# Patient Record
Sex: Female | Born: 1990
Health system: Southern US, Community
[De-identification: ages and names within clinical notes are randomized; demographics above are authoritative.]

## PROBLEM LIST (undated history)

## (undated) DIAGNOSIS — J45909 Unspecified asthma, uncomplicated: Secondary | ICD-10-CM

## (undated) DIAGNOSIS — J309 Allergic rhinitis, unspecified: Secondary | ICD-10-CM

## (undated) HISTORY — DX: Unspecified asthma, uncomplicated: J45.909

## (undated) HISTORY — DX: Allergic rhinitis, unspecified: J30.9

---

## 2009-04-28 ENCOUNTER — Encounter: Admission: RE | Admit: 2009-04-28 | Discharge: 2009-04-28 | Payer: Self-pay | Admitting: Family Medicine

## 2010-04-11 ENCOUNTER — Encounter
Admission: RE | Admit: 2010-04-11 | Discharge: 2010-04-11 | Payer: Self-pay | Source: Home / Self Care | Attending: Allergy and Immunology | Admitting: Allergy and Immunology

## 2011-04-17 HISTORY — PX: OTHER SURGICAL HISTORY: SHX169

## 2011-06-12 ENCOUNTER — Other Ambulatory Visit: Payer: Self-pay | Admitting: Physician Assistant

## 2011-06-12 MED ORDER — TRIAMCINOLONE ACETONIDE(NASAL) 55 MCG/ACT NA INHA: 2.0000 | Freq: Every day | NASAL | Status: DC

## 2011-12-04 ENCOUNTER — Encounter: Payer: Self-pay | Admitting: Physician Assistant

## 2011-12-04 DIAGNOSIS — J309 Allergic rhinitis, unspecified: Secondary | ICD-10-CM | POA: Insufficient documentation

## 2011-12-04 DIAGNOSIS — J45909 Unspecified asthma, uncomplicated: Secondary | ICD-10-CM

## 2011-12-04 DIAGNOSIS — IMO0001 Reserved for inherently not codable concepts without codable children: Secondary | ICD-10-CM | POA: Insufficient documentation

## 2012-06-10 ENCOUNTER — Telehealth: Payer: Self-pay | Admitting: Internal Medicine

## 2012-06-10 NOTE — Telephone Encounter (Signed)
Pt would like to be a new pt w/ you. Her mother is Sherry [place], your patient too. All new pt appointments are on Weds. Pt would need to come in on a Friday, due to her work schedule. Any month ok. Pls advise. (Pt has [employer])

## 2012-06-11 NOTE — Telephone Encounter (Signed)
Ok am appt 30 minutes  In April .

## 2012-06-12 NOTE — Telephone Encounter (Signed)
lmom/kjh 

## 2012-06-13 ENCOUNTER — Encounter: Payer: Self-pay | Admitting: Pulmonary Disease

## 2012-06-13 ENCOUNTER — Ambulatory Visit (INDEPENDENT_AMBULATORY_CARE_PROVIDER_SITE_OTHER): Payer: BC Managed Care – PPO | Admitting: Pulmonary Disease

## 2012-06-13 VITALS — BP 110/72 | HR 102 | Temp 98.3°F | Ht 65.0 in | Wt 120.0 lb

## 2012-06-13 DIAGNOSIS — J309 Allergic rhinitis, unspecified: Secondary | ICD-10-CM

## 2012-06-13 DIAGNOSIS — J45909 Unspecified asthma, uncomplicated: Secondary | ICD-10-CM

## 2012-06-13 MED ORDER — MOMETASONE FURO-FORMOTEROL FUM 100-5 MCG/ACT IN AERO: 2.0000 | INHALATION_SPRAY | Freq: Two times a day (BID) | RESPIRATORY_TRACT | Status: DC

## 2012-06-13 NOTE — Progress Notes (Signed)
Chief Complaint  Patient presents with  . Pulmonary Consult    self referral for Asthma    History of Present Illness: Alison Butler is a 22 y.o. female for evaluation of asthma.  She has severe asthma and allergies, and has suffered from these for years.  She has lived in multiple locations over the past 10 years, but is originally from [place].  She was living in [place] before moving to [place].  She has been in hospital twice because of her asthma.  She has been followed by Dr. Eileen Stanford for her allergies.  She has multiple environmental allergens.  She is also allergic to dogs, cates, and dust mites.  She was previously on allergy shots.  She got a reaction to these which caused arm swelling, and she felt that allergy shots "sucked".  As a result she stopped taking allergy shots.  She was considered for Xolair, but she was concerned about expense of therapy and potential heart/cancer side effects.  She is not aware of any problems taking aspirin.  She was enrolled in trial at [facility] with Dr. Carmie Kanner for bronchothermoplasty.  She had this done from November 2013 to December 2013.  Her breathing has been doing better since she had this intervention, and non longer feels like she has her asthma symptoms any more.  She would have trouble strong smells, and cold/wet weather.  She used to get frequent episodes of chest tightness, but not so since she had procedure at [facility].  She never smoked.  She works as a Sales executive.  There is no history of pneumonia.  She does not have any pets.  Tests: CXR 04/11/10 >> Mild bronchial thickening. CT sinus 04/11/10 >> 3 mm polypoid lesion lateral aspect Lt maxillary sinus, mild mucosal thickening posterior ethmoid sinus cells Rt > Lt November 2013 >> Bronchothermoplasty [facility] with Dr. Carmie Kanner [facility] [place] contact at [facility] - [phone]) Spirometry 04/24/12 >> FEV1 2.30 (66%), FEV1% 75  Alison Butler  has a past medical history of Asthma and  Allergic rhinitis.  Alison Butler  has past surgical history that includes BRONCHOTHERMOPLASTY (2013).  Prior to Admission medications   Medication Sig Start Date End Date Taking? Authorizing Provider  DULERA 200-5 MCG/ACT AERO Inhale 2 puffs into the lungs 2 (two) times daily. 06/02/12  Yes Historical Provider, MD  levocetirizine (XYZAL) 5 MG tablet Take 1 tablet by mouth daily. 05/24/12  Yes Historical Provider, MD  loratadine (CLARITIN) 10 MG tablet Take 10 mg by mouth daily.   Yes Historical Provider, MD  montelukast (SINGULAIR) 10 MG tablet Take 1 tablet by mouth daily. 05/24/12  Yes Historical Provider, MD  NUVARING 0.12-0.015 MG/24HR vaginal ring Place 1 each vaginally every 30 (thirty) days. 06/11/12  Yes Historical Provider, MD  PROAIR HFA 108 (90 BASE) MCG/ACT inhaler Inhale 2 puffs into the lungs as needed. 04/24/12  Yes Historical Provider, MD  triamcinolone (NASACORT AQ) 55 MCG/ACT nasal inhaler Place 2 sprays into the nose daily. 06/12/11 06/13/13 Yes Ryan M Dunn, PA-C    Allergies  Allergen Reactions  . Augmentin (Amoxicillin-Pot Clavulanate) Shortness Of Breath  . Hydrocodone Nausea And Vomiting    family history includes Asthma in her father.   reports that she has never smoked. She has never used smokeless tobacco. She reports that  drinks alcohol. She reports that she does not use illicit drugs.  Review of Systems  Constitutional: Negative for fever and unexpected weight change.  HENT: Positive for ear pain, sore throat and sneezing.  Negative for nosebleeds, congestion, rhinorrhea, trouble swallowing, dental problem, postnasal drip and sinus pressure.   Eyes: Positive for itching. Negative for redness.  Respiratory: Negative for cough, chest tightness, shortness of breath and wheezing.   Cardiovascular: Negative for palpitations and leg swelling.  Gastrointestinal: Negative for nausea and vomiting.  Genitourinary: Negative for dysuria.  Musculoskeletal: Negative for joint  swelling.  Skin: Negative for rash.  Neurological: Positive for headaches.  Hematological: Bruises/bleeds easily.  Psychiatric/Behavioral: Negative for dysphoric mood. The patient is not nervous/anxious.    Physical Exam:  General - No distress ENT - No sinus tenderness, no oral exudate, no LAN, no thyromegaly, TM clear, pupils equal/reactive Cardiac - s1s2 regular, no murmur, pulses symmetric Chest - No wheeze/rales/dullness, good air entry, normal respiratory excursion Back - No focal tenderness Abd - Soft, non-tender, no organomegaly, + bowel sounds Ext - No edema Neuro - Normal strength, cranial nerves intact Skin - No rashes Psych - Normal mood, and behavior   Dg Chest 2 View  04/11/2010  Clinical Data: Cough.  Wheezing.  Asthma.   CHEST - 2 VIEW   Comparison: None   Findings: Heart size is normal.  Mediastinal shadows are normal. The lungs are clear.  There may be minimal bronchial thickening. No infiltrate, collapse or effusion.  Bony structures are unremarkable.   IMPRESSION: Possible mild bronchial thickening.  No infiltrate or collapse.  Provider: Donnita Falls   Ct Sinus Ltd W/o Cm  04/11/2010  Clinical Data: Cough and wheezing.  Asthma.   CT PARANASAL SINUS LIMITED WITHOUT CONTRAST   Technique:  Multidetector CT images of the paranasal sinuses were obtained in a single plane without contrast.   Comparison: None.   Findings: 3 mm polypoid lesion lateral aspect left maxillary sinus which is otherwise clear.  Right maxillary sinus is clear.  The infundibulum is patent bilaterally.   Visualized frontal sinuses are clear.   Mild mucosal thickening posterior ethmoid sinus air cells more notable on the right.   Visualized sphenoid sinus air cells are clear.   Visualized portions of the mastoid air cells and middle ear cavities are clear.   Anterior aspect of the nasal septum deviated towards the left.   Visualized intracranial structures and orbital structures are unremarkable.    IMPRESSION: 3 mm polypoid lesion lateral aspect left maxillary sinus which is otherwise clear.  Right maxillary sinus is clear.  The infundibulum is patent bilaterally.   Visualized frontal sinuses are clear.   Mild mucosal thickening posterior ethmoid sinus air cells more notable on the right.   Visualized sphenoid sinus air cells are clear.  Provider: Sheilah Mins    Assessment/Plan:  Coralyn Helling, MD [facility] Pager:  [phone] 06/17/2012, 9:12 AM

## 2012-06-13 NOTE — Progress Notes (Deleted)
  Subjective:    Patient ID: Alison Butler, female    DOB: 1991-04-02, 22 y.o.   MRN: [id number]  HPI    Review of Systems  Constitutional: Negative for fever and unexpected weight change.  HENT: Positive for ear pain, sore throat and sneezing. Negative for nosebleeds, congestion, rhinorrhea, trouble swallowing, dental problem, postnasal drip and sinus pressure.   Eyes: Positive for itching. Negative for redness.  Respiratory: Negative for cough, chest tightness, shortness of breath and wheezing.   Cardiovascular: Negative for palpitations and leg swelling.  Gastrointestinal: Negative for nausea and vomiting.  Genitourinary: Negative for dysuria.  Musculoskeletal: Negative for joint swelling.  Skin: Negative for rash.  Neurological: Positive for headaches.  Hematological: Bruises/bleeds easily.  Psychiatric/Behavioral: Negative for dysphoric mood. The patient is not nervous/anxious.        Objective:   Physical Exam        Assessment & Plan:

## 2012-06-13 NOTE — Patient Instructions (Addendum)
Change dulera to 100/5 two puffs twice per day Monitor your peak flow meter readings at home Will get records from Dr. Eileen Stanford Will get records from [facility] contact at [facility] - [phone]) Follow up in 6 weeks

## 2012-06-17 ENCOUNTER — Encounter: Payer: Self-pay | Admitting: Pulmonary Disease

## 2012-06-17 NOTE — Assessment & Plan Note (Addendum)
She has improved considerable after bronchothermoplasty.  She is interested in trying to decrease her medication burden.  Will decrease her dose of dulera to 100/5 two puffs twice per day (previously on 200/5 strength of dulera).  Will leave remainder of her regimen as is until follow up in 6 weeks.  Will request records from clinical trial at [facility] for bronchothermoplasty.

## 2012-06-17 NOTE — Assessment & Plan Note (Signed)
She is to continue her current regimen through Dr. Barnetta Chapel.  Reviewed benefits/risks of xolair, and advised her to discuss further with Dr. Barnetta Chapel if she is interested in pursuing this.  Will request records from Dr. Zelphia Cairo office.

## 2012-07-14 ENCOUNTER — Telehealth: Payer: Self-pay | Admitting: Pulmonary Disease

## 2012-07-14 NOTE — Telephone Encounter (Signed)
Please schedule pt for follow up August 2014.

## 2012-07-14 NOTE — Telephone Encounter (Signed)
lmomtcb to make the pt aware of VS recs.

## 2012-07-14 NOTE — Telephone Encounter (Signed)
Pt returned triage's call.  Holly D Pryor ° °

## 2012-07-14 NOTE — Telephone Encounter (Signed)
Spoke with pt She states that when she decreased the dulera to the 100 mcg strength she noticed fatigue and slight increase in SOB She went back on the 200 mcg strength and feels normal again She states that she is doing so well, does not feel the April appt is needed, and wants to know if she can followup at a later date to save money on her high co pay Please advise thanks!

## 2012-07-14 NOTE — Telephone Encounter (Signed)
lmomtcb x1 

## 2012-07-15 NOTE — Telephone Encounter (Signed)
LMOMTCB x 1 

## 2012-07-15 NOTE — Telephone Encounter (Signed)
Spoke with patient, patient aware of recs per Dr. Craige Cotta Dr. Silverio Lay scheduled is not out at thsi time, have placed a phone recall in Epic for patient. Patient aware of this and nothing further needed at this time.

## 2012-07-25 ENCOUNTER — Ambulatory Visit (INDEPENDENT_AMBULATORY_CARE_PROVIDER_SITE_OTHER): Payer: BC Managed Care – PPO | Admitting: Internal Medicine

## 2012-07-25 ENCOUNTER — Encounter: Payer: Self-pay | Admitting: Internal Medicine

## 2012-07-25 VITALS — BP 108/70 | HR 74 | Temp 98.8°F | Wt 118.0 lb

## 2012-07-25 DIAGNOSIS — J45909 Unspecified asthma, uncomplicated: Secondary | ICD-10-CM

## 2012-07-25 DIAGNOSIS — Z8349 Family history of other endocrine, nutritional and metabolic diseases: Secondary | ICD-10-CM

## 2012-07-25 DIAGNOSIS — G4720 Circadian rhythm sleep disorder, unspecified type: Secondary | ICD-10-CM | POA: Insufficient documentation

## 2012-07-25 DIAGNOSIS — R350 Frequency of micturition: Secondary | ICD-10-CM | POA: Insufficient documentation

## 2012-07-25 DIAGNOSIS — Z299 Encounter for prophylactic measures, unspecified: Secondary | ICD-10-CM

## 2012-07-25 DIAGNOSIS — G479 Sleep disorder, unspecified: Secondary | ICD-10-CM

## 2012-07-25 DIAGNOSIS — E559 Vitamin D deficiency, unspecified: Secondary | ICD-10-CM

## 2012-07-25 LAB — BASIC METABOLIC PANEL
BUN: 13 mg/dL (ref 6–23)
CO2: 21 mEq/L (ref 19–32)
Calcium: 9 mg/dL (ref 8.4–10.5)
Creatinine, Ser: 0.9 mg/dL (ref 0.4–1.2)
Glucose, Bld: 91 mg/dL (ref 70–99)

## 2012-07-25 LAB — POCT URINALYSIS DIP (MANUAL ENTRY)
Ketones, POC UA: NEGATIVE
Protein Ur, POC: NEGATIVE
pH, UA: 7

## 2012-07-25 LAB — CBC WITH DIFFERENTIAL/PLATELET
Basophils Absolute: 0 10*3/uL (ref 0.0–0.1)
Basophils Relative: 0.5 % (ref 0.0–3.0)
Eosinophils Relative: 0.9 % (ref 0.0–5.0)
Lymphocytes Relative: 21 % (ref 12.0–46.0)
MCHC: 33.8 g/dL (ref 30.0–36.0)
Monocytes Relative: 6.3 % (ref 3.0–12.0)
Neutro Abs: 5.2 10*3/uL (ref 1.4–7.7)
Neutrophils Relative %: 71.3 % (ref 43.0–77.0)
Platelets: 201 10*3/uL (ref 150.0–400.0)
RBC: 4.36 Mil/uL (ref 3.87–5.11)

## 2012-07-25 LAB — HEPATIC FUNCTION PANEL
AST: 20 U/L (ref 0–37)
Total Protein: 6.8 g/dL (ref 6.0–8.3)

## 2012-07-25 LAB — TSH: TSH: 0.66 u[IU]/mL (ref 0.35–5.50)

## 2012-07-25 LAB — HEMOGLOBIN A1C: Hgb A1c MFr Bld: 5.2 % (ref 4.6–6.5)

## 2012-07-25 LAB — T4, FREE: Free T4: 0.83 ng/dL (ref 0.60–1.60)

## 2012-07-25 LAB — LIPID PANEL
LDL Cholesterol: 85 mg/dL (ref 0–99)
Total CHOL/HDL Ratio: 3

## 2012-07-25 NOTE — Progress Notes (Signed)
Chief Complaint  Patient presents with  . Establish Care    Is worried about her family history.  She would like some lab work today.  She is not sleeping well and is getting up in the middle of the night to urinate and this is unusual.    HPI: Patient comes in as new patient visit . Previous care was  Specialist    No pcp and Dr Hal Hope.  Would like to get checked out   And has looked on google for sx and concerned about diabetes and thyroid . Sleep  And urinary frequency .   Periods  monthly on nuvaring   .    No fever.  No excess caffiene  Does drink a lot   . Tries to drink 64 oz water per day.  Hx of high vit d. rx cause of low level? Told to take high dose 5000 iu per year.   Cord  Wrapped and nicu  For 4 days.   Asthma  Since young.  Problematic and not greatly controlled  On meds . Since had thermoplasty  Had sx even on sx  So   Now can run and doesn't get  Real sob now.   hosp x 2 when young for asthma  Last td 2006 ROS: See pertinent positives and negatives per HPI. Neg cp new sob gi gu otherwise ms neuro bleeding ln fever weight concern or ortho issues   12 system review  Past Medical History  Diagnosis Date  . Asthma      hosp x 2 last 2004; bronchial thermoplasty [facility] 2013 very helpful   . Allergic rhinitis     "to everything"    Family History  Problem Relation Age of Onset  . Asthma Father   . Thyroid disease Father   . Thyroid disease Mother   . Heart disease Maternal Grandfather   . Stroke Maternal Grandfather   . Hypertension Maternal Grandfather   . Cancer Paternal Grandfather     Pancreatic    History   Social History  . Marital Status: Single    Spouse Name: N/A    Number of Children: N/A  . Years of Education: N/A   Social History Main Topics  . Smoking status: Never Smoker   . Smokeless tobacco: Never Used  . Alcohol Use: Yes  . Drug Use: No  . Sexually Active: None   Other Topics Concern  . None   Social History Narrative   6-8 hours  of sleep per night   4 people living in the home;parent and great aunt who is moving out.    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 levocetirizine (XYZAL) 5 MG tablet Take 1 tablet by mouth daily.      Marland Kitchen loratadine (CLARITIN) 10 MG tablet Take 10 mg by mouth daily.      . mometasone-formoterol (DULERA) 200-5 MCG/ACT AERO Inhale 2 puffs into the lungs 2 (two) times daily.      . montelukast (SINGULAIR) 10 MG tablet Take 1 tablet by mouth daily.      Marland Kitchen NUVARING 0.12-0.015 MG/24HR vaginal ring Place 1 each vaginally every 30 (thirty) days.      Marland Kitchen PROAIR HFA 108 (90 BASE) MCG/ACT inhaler Inhale  2 puffs into the lungs as needed.      . triamcinolone (NASACORT) 55 MCG/ACT nasal inhaler Place 2 sprays into the nose daily.      . [DISCONTINUED] mometasone-formoterol (DULERA) 100-5 MCG/ACT AERO Inhale 2 puffs into the lungs 2 (two) times daily.  1 Inhaler  5  . [DISCONTINUED] triamcinolone (NASACORT AQ) 55 MCG/ACT nasal inhaler Place 2 sprays into the nose daily.  1 Inhaler  12   No facility-administered encounter medications on file as of 07/25/2012.    EXAM:  BP 108/70  Pulse 74  Temp(Src) 98.8 F (37.1 C) (Oral)  Wt 118 lb (53.524 kg)  BMI 19.64 kg/m2  SpO2 98%  LMP 07/02/2012  Body mass index is 19.64 kg/(m^2).  GENERAL: vitals reviewed and listed above, alert, oriented, appears well hydrated and in no acute distress  HEENT: atraumatic, conjunctiva  clear, no obvious abnormalities on inspection of external nose and ears OP : no lesion edema or exudate glasses   NECK: no obvious masses on inspection palpation  No adenopathy or bruit   LUNGS: clear to auscultation bilaterally, no wheezes, rales or rhonchi, good air movement  CV: HRRR, no clubbing cyanosis or  peripheral edema nl cap refill  No g or m    Abdomen:  Sof,t normal bowel sounds without hepatosplenomegaly, no guarding rebound or masses no CVA tenderness Is ticklish so  infomplete exam  No obv masses MS: moves all extremities without noticeable focal  abnormality NEURO: oriented x 3 CN 3-12 appear intact. No focal muscle weakness or atrophy. DTRs symmetrical. Gait WNL.  Grossly non focal. No tremor or abnormal movement. PSYCH: pleasant and cooperative, no obvious depression or anxiety  ASSESSMENT AND PLAN:  Discussed the following assessment and plan:  Urinary frequency - free water drinker  avoid caffien check metabolic endocrine - Plan: mometasone-formoterol (DULERA) 200-5 MCG/ACT AERO, triamcinolone (NASACORT) 55 MCG/ACT nasal inhaler, Basic metabolic panel, CBC with Differential, Hemoglobin A1c, Hepatic function panel, TSH, T4, free, POCT urinalysis dipstick, Vitamin D 25 hydroxy, Lipid panel  Unspecified vitamin D deficiency - limit dose check level - Plan: mometasone-formoterol (DULERA) 200-5 MCG/ACT AERO, triamcinolone (NASACORT) 55 MCG/ACT nasal inhaler, Basic metabolic panel, CBC with Differential, Hemoglobin A1c, Hepatic function panel, TSH, T4, free, POCT urinalysis dipstick, Vitamin D 25 hydroxy, Lipid panel  Extrinsic asthma - Plan: mometasone-formoterol (DULERA) 200-5 MCG/ACT AERO, triamcinolone (NASACORT) 55 MCG/ACT nasal inhaler, Basic metabolic panel, CBC with Differential, Hemoglobin A1c, Hepatic function panel, TSH, T4, free, POCT urinalysis dipstick, Vitamin D 25 hydroxy, Lipid panel  Preventive measure - screening lipids etc  - Plan: mometasone-formoterol (DULERA) 200-5 MCG/ACT AERO, triamcinolone (NASACORT) 55 MCG/ACT nasal inhaler, Basic metabolic panel, CBC with Differential, Hemoglobin A1c, Hepatic function panel, TSH, T4, free, POCT urinalysis dipstick, Vitamin D 25 hydroxy, Lipid panel  Family history of thyroid problem - mom I131 ablation   Sleep pattern disturbance - counseled multiple factors    Asthma  -Patient advised to return or notify health care team  if symptoms worsen or persist or new concerns arise.  Patient Instructions  Decrease   Vit d to most 5000 iu.per day   Will check level today .  Avoid caffiene after 2 pm   Will notify you  of labs when available.   Can check on my chart  Urinary Frequency The number of times a normal person urinates depends upon how much liquid they take in and how much liquid they are losing. If the temperature is hot and there is high humidity  then the person will sweat more and usually breathe a little more frequently. These factors decrease the amount of frequency of urination that would be considered normal. The amount you drink is easily determined, but the amount of fluid lost is sometimes more difficult to calculate.  Fluid is lost in two ways:  Sensible fluid loss is usually measured by the amount of urine that you get rid of. Losses of fluid can also occur with diarrhea.  Insensible fluid loss is more difficult to measure. It is caused by evaporation. Insensible loss of fluid occurs through breathing and sweating. It usually ranges from a little less than a quart to a little more than a quart of fluid a day. In normal temperatures and activity levels the average person may urinate 4 to 7 times in a 24-hour period. Needing to urinate more often than that could indicate a problem. If one urinates 4 to 7 times in 24 hours and has large volumes each time, that could indicate a different problem from one who urinates 4 to 7 times a day and has small volumes. The time of urinating is also an important. Most urinating should be done during the waking hours. Getting up at night to urinate frequently can indicate some problems. CAUSES  The bladder is the organ in your lower abdomen that holds urine. Like a balloon, it swells some as it fills up. Your nerves sense this and tell you it is time to head for the bathroom. There are a number of  reasons that you might feel the need to urinate more often than usual. They include:  Urinary tract infection. This is usually associated with other signs such as burning when you urinate.  In men, problems with the prostate (a walnut-size gland that is located near the tube that carries urine out of your body). There are two reasons why the prostate can cause an increased frequency of urination:  An enlarged prostate that does not let the bladder empty well. If the bladder only half empties when you urinate then it only has half the capacity to fill before you have to urinate again.  The nerves in the bladder become more hypersensitive with an increased size of the prostate even if the bladder empties completely.  Pregnancy.  Obesity. Excess weight is more likely to cause a problem for women more than for men.  Bladder stones or other bladder problems.  Caffeine.  Alcohol.  Medications. For example, drugs that help the body get rid of extra fluid (diuretics) increase urine production. Some other medicines must be taken with lots of fluids.  Muscle or nerve weakness. This might be the result of a spinal cord injury, a stroke, multiple sclerosis or Parkinson's disease.  Long-standing diabetes can decrease the sensation of the bladder. This loss of sensation makes it harder to sense the bladder needs to be emptied. Over a period of years the bladder is stretched out by constant overfilling. This weakens the bladder muscles so that the bladder does not empty well and has less capacity to fill with new urine.  Interstitial cystitis (also called painful bladder syndrome). This condition develops because the tissues that line the insider of the bladder are inflamed (inflammation is the body's way of reacting to injury or infection). It causes pain and frequent urination. It occurs in women more often than in men. DIAGNOSIS   To decide what might be causing your urinary frequency, your  healthcare provider will probably:  Ask about symptoms you  have noticed.  Ask about your overall health. This will include questions about any medications you are taking.  Do a physical examination.  Order some tests. These might include:  A blood test to check for diabetes or other health issues that could be contributing to the problem.  Urine testing. This could measure the flow of urine and the pressure on the bladder.  A test of your neurological system (the brain, spinal cord and nerves). This is the system that senses the need to urinate.  A bladder test to check whether it is emptying completely when you urinate.  Cytoscopy. This test uses a thin tube with a tiny camera on it. It offers a look inside your urethra and bladder to see if there are problems.  Imaging tests. You might be given a contrast dye and then asked to urinate. X-rays are taken to see how your bladder is working. TREATMENT  It is important for you to be evaluated to determine if the amount or frequency that you have is unusual or abnormal. If it is found to be abnormal the cause should be determined and this can usually be found out easily. Depending upon the cause treatment could include medication, stimulation of the nerves, or surgery. There are not too many things that you can do as an individual to change your urinary frequency. It is important that you balance the amount of fluid intake needed to compensate for your activity and the temperature. Medical problems will be diagnosed and taken care of by your physician. There is no particular bladder training such as Kegel's exercises that you can do to help urinary frequency. This is an exercise this is usually done for people who have leaking of urine when they laugh cough or sneeze. HOME CARE INSTRUCTIONS   Take any medications your healthcare provider prescribed or suggested. Follow the directions carefully.  Practice any lifestyle changes that are  recommended. These might include:  Drinking less fluid or drinking at different times of the day. If you need to urinate often during the night, for example, you may need to stop drinking fluids early in the evening.  Cutting down on caffeine or alcohol. They both can make you need to urinate more often than normal. Caffeine is found in coffee, tea and sodas.  Losing weight, if that is recommended.  Keep a journal or a log. You might be asked to record how much you drink and when and when you feel the need to urinate. This will also help evaluate how well the treatment provided by your physician is working. SEEK MEDICAL CARE IF:   Your need to urinate often gets worse.  You feel increased pain or irritation when you urinate.  You notice blood in your urine.  You have questions about any medications that your healthcare provider recommended.  You notice blood, pus or swelling at the site of any test or treatment procedure.  You develop a fever of more than 100.5 F (38.1 C). SEEK IMMEDIATE MEDICAL CARE IF:  You develop a fever of more than 102.0 F (38.9 C). Document Released: 01/27/2009 Document Revised: 06/25/2011 Document Reviewed: 01/27/2009 [facility] Patient Information 2013 [place], [place].  Insomnia Insomnia is frequent trouble falling and/or staying asleep. Insomnia can be a long term problem or a short term problem. Both are common. Insomnia can be a short term problem when the wakefulness is related to a certain stress or worry. Long term insomnia is often related to ongoing stress during waking hours  and/or poor sleeping habits. Overtime, sleep deprivation itself can make the problem worse. Every little thing feels more severe because you are overtired and your ability to cope is decreased. CAUSES   Stress, anxiety, and depression.  Poor sleeping habits.  Distractions such as TV in the bedroom.  Naps close to bedtime.  Engaging in emotionally charged conversations  before bed.  Technical reading before sleep.  Alcohol and other sedatives. They may make the problem worse. They can hurt normal sleep patterns and normal dream activity.  Stimulants such as caffeine for several hours prior to bedtime.  Pain syndromes and shortness of breath can cause insomnia.  Exercise late at night.  Changing time zones may cause sleeping problems (jet lag). It is sometimes helpful to have someone observe your sleeping patterns. They should look for periods of not breathing during the night (sleep apnea). They should also look to see how long those periods last. If you live alone or observers are uncertain, you can also be observed at a sleep clinic where your sleep patterns will be professionally monitored. Sleep apnea requires a checkup and treatment. Give your caregivers your medical history. Give your caregivers observations your family has made about your sleep.  SYMPTOMS   Not feeling rested in the morning.  Anxiety and restlessness at bedtime.  Difficulty falling and staying asleep. TREATMENT   Your caregiver may prescribe treatment for an underlying medical disorders. Your caregiver can give advice or help if you are using alcohol or other drugs for self-medication. Treatment of underlying problems will usually eliminate insomnia problems.  Medications can be prescribed for short time use. They are generally not recommended for lengthy use.  Over-the-counter sleep medicines are not recommended for lengthy use. They can be habit forming.  You can promote easier sleeping by making lifestyle changes such as:  Using relaxation techniques that help with breathing and reduce muscle tension.  Exercising earlier in the day.  Changing your diet and the time of your last meal. No night time snacks.  Establish a regular time to go to bed.  Counseling can help with stressful problems and worry.  Soothing music and white noise may be helpful if there are  background noises you cannot remove.  Stop tedious detailed work at least one hour before bedtime. HOME CARE INSTRUCTIONS   Keep a diary. Inform your caregiver about your progress. This includes any medication side effects. See your caregiver regularly. Take note of:  Times when you are asleep.  Times when you are awake during the night.  The quality of your sleep.  How you feel the next day. This information will help your caregiver care for you.  Get out of bed if you are still awake after 15 minutes. Read or do some quiet activity. Keep the lights down. Wait until you feel sleepy and go back to bed.  Keep regular sleeping and waking hours. Avoid naps.  Exercise regularly.  Avoid distractions at bedtime. Distractions include watching television or engaging in any intense or detailed activity like attempting to balance the household checkbook.  Develop a bedtime ritual. Keep a familiar routine of bathing, brushing your teeth, climbing into bed at the same time each night, listening to soothing music. Routines increase the success of falling to sleep faster.  Use relaxation techniques. This can be using breathing and muscle tension release routines. It can also include visualizing peaceful scenes. You can also help control troubling or intruding thoughts by keeping your mind occupied with boring  or repetitive thoughts like the old concept of counting sheep. You can make it more creative like imagining planting one beautiful flower after another in your backyard garden.  During your day, work to eliminate stress. When this is not possible use some of the previous suggestions to help reduce the anxiety that accompanies stressful situations. MAKE SURE YOU:   Understand these instructions.  Will watch your condition.  Will get help right away if you are not doing well or get worse. Document Released: 03/30/2000 Document Revised: 06/25/2011 Document Reviewed: 04/30/2007 [facility]  Patient Information 2013 [place], [place].      Neta Mends. Panosh M.D.

## 2012-07-25 NOTE — Patient Instructions (Addendum)
Decrease   Vit d to most 5000 iu.per day   Will check level today .  Avoid caffiene after 2 pm   Will notify you  of labs when available.   Can check on my chart  Urinary Frequency The number of times a normal person urinates depends upon how much liquid they take in and how much liquid they are losing. If the temperature is hot and there is high humidity then the person will sweat more and usually breathe a little more frequently. These factors decrease the amount of frequency of urination that would be considered normal. The amount you drink is easily determined, but the amount of fluid lost is sometimes more difficult to calculate.  Fluid is lost in two ways:  Sensible fluid loss is usually measured by the amount of urine that you get rid of. Losses of fluid can also occur with diarrhea.  Insensible fluid loss is more difficult to measure. It is caused by evaporation. Insensible loss of fluid occurs through breathing and sweating. It usually ranges from a little less than a quart to a little more than a quart of fluid a day. In normal temperatures and activity levels the average person may urinate 4 to 7 times in a 24-hour period. Needing to urinate more often than that could indicate a problem. If one urinates 4 to 7 times in 24 hours and has large volumes each time, that could indicate a different problem from one who urinates 4 to 7 times a day and has small volumes. The time of urinating is also an important. Most urinating should be done during the waking hours. Getting up at night to urinate frequently can indicate some problems. CAUSES  The bladder is the organ in your lower abdomen that holds urine. Like a balloon, it swells some as it fills up. Your nerves sense this and tell you it is time to head for the bathroom. There are a number of reasons that you might feel the need to urinate more often than usual. They include:  Urinary tract infection. This is usually associated with other  signs such as burning when you urinate.  In men, problems with the prostate (a walnut-size gland that is located near the tube that carries urine out of your body). There are two reasons why the prostate can cause an increased frequency of urination:  An enlarged prostate that does not let the bladder empty well. If the bladder only half empties when you urinate then it only has half the capacity to fill before you have to urinate again.  The nerves in the bladder become more hypersensitive with an increased size of the prostate even if the bladder empties completely.  Pregnancy.  Obesity. Excess weight is more likely to cause a problem for women more than for men.  Bladder stones or other bladder problems.  Caffeine.  Alcohol.  Medications. For example, drugs that help the body get rid of extra fluid (diuretics) increase urine production. Some other medicines must be taken with lots of fluids.  Muscle or nerve weakness. This might be the result of a spinal cord injury, a stroke, multiple sclerosis or Parkinson's disease.  Long-standing diabetes can decrease the sensation of the bladder. This loss of sensation makes it harder to sense the bladder needs to be emptied. Over a period of years the bladder is stretched out by constant overfilling. This weakens the bladder muscles so that the bladder does not empty well and has less capacity to  fill with new urine.  Interstitial cystitis (also called painful bladder syndrome). This condition develops because the tissues that line the insider of the bladder are inflamed (inflammation is the body's way of reacting to injury or infection). It causes pain and frequent urination. It occurs in women more often than in men. DIAGNOSIS   To decide what might be causing your urinary frequency, your healthcare provider will probably:  Ask about symptoms you have noticed.  Ask about your overall health. This will include questions about any medications  you are taking.  Do a physical examination.  Order some tests. These might include:  A blood test to check for diabetes or other health issues that could be contributing to the problem.  Urine testing. This could measure the flow of urine and the pressure on the bladder.  A test of your neurological system (the brain, spinal cord and nerves). This is the system that senses the need to urinate.  A bladder test to check whether it is emptying completely when you urinate.  Cytoscopy. This test uses a thin tube with a tiny camera on it. It offers a look inside your urethra and bladder to see if there are problems.  Imaging tests. You might be given a contrast dye and then asked to urinate. X-rays are taken to see how your bladder is working. TREATMENT  It is important for you to be evaluated to determine if the amount or frequency that you have is unusual or abnormal. If it is found to be abnormal the cause should be determined and this can usually be found out easily. Depending upon the cause treatment could include medication, stimulation of the nerves, or surgery. There are not too many things that you can do as an individual to change your urinary frequency. It is important that you balance the amount of fluid intake needed to compensate for your activity and the temperature. Medical problems will be diagnosed and taken care of by your physician. There is no particular bladder training such as Kegel's exercises that you can do to help urinary frequency. This is an exercise this is usually done for people who have leaking of urine when they laugh cough or sneeze. HOME CARE INSTRUCTIONS   Take any medications your healthcare provider prescribed or suggested. Follow the directions carefully.  Practice any lifestyle changes that are recommended. These might include:  Drinking less fluid or drinking at different times of the day. If you need to urinate often during the night, for example, you  may need to stop drinking fluids early in the evening.  Cutting down on caffeine or alcohol. They both can make you need to urinate more often than normal. Caffeine is found in coffee, tea and sodas.  Losing weight, if that is recommended.  Keep a journal or a log. You might be asked to record how much you drink and when and when you feel the need to urinate. This will also help evaluate how well the treatment provided by your physician is working. SEEK MEDICAL CARE IF:   Your need to urinate often gets worse.  You feel increased pain or irritation when you urinate.  You notice blood in your urine.  You have questions about any medications that your healthcare provider recommended.  You notice blood, pus or swelling at the site of any test or treatment procedure.  You develop a fever of more than 100.5 F (38.1 C). SEEK IMMEDIATE MEDICAL CARE IF:  You develop a fever of  more than 102.0 F (38.9 C). Document Released: 01/27/2009 Document Revised: 06/25/2011 Document Reviewed: 01/27/2009 [facility] Patient Information 2013 [place], [place].  Insomnia Insomnia is frequent trouble falling and/or staying asleep. Insomnia can be a long term problem or a short term problem. Both are common. Insomnia can be a short term problem when the wakefulness is related to a certain stress or worry. Long term insomnia is often related to ongoing stress during waking hours and/or poor sleeping habits. Overtime, sleep deprivation itself can make the problem worse. Every little thing feels more severe because you are overtired and your ability to cope is decreased. CAUSES   Stress, anxiety, and depression.  Poor sleeping habits.  Distractions such as TV in the bedroom.  Naps close to bedtime.  Engaging in emotionally charged conversations before bed.  Technical reading before sleep.  Alcohol and other sedatives. They may make the problem worse. They can hurt normal sleep patterns and normal dream  activity.  Stimulants such as caffeine for several hours prior to bedtime.  Pain syndromes and shortness of breath can cause insomnia.  Exercise late at night.  Changing time zones may cause sleeping problems (jet lag). It is sometimes helpful to have someone observe your sleeping patterns. They should look for periods of not breathing during the night (sleep apnea). They should also look to see how long those periods last. If you live alone or observers are uncertain, you can also be observed at a sleep clinic where your sleep patterns will be professionally monitored. Sleep apnea requires a checkup and treatment. Give your caregivers your medical history. Give your caregivers observations your family has made about your sleep.  SYMPTOMS   Not feeling rested in the morning.  Anxiety and restlessness at bedtime.  Difficulty falling and staying asleep. TREATMENT   Your caregiver may prescribe treatment for an underlying medical disorders. Your caregiver can give advice or help if you are using alcohol or other drugs for self-medication. Treatment of underlying problems will usually eliminate insomnia problems.  Medications can be prescribed for short time use. They are generally not recommended for lengthy use.  Over-the-counter sleep medicines are not recommended for lengthy use. They can be habit forming.  You can promote easier sleeping by making lifestyle changes such as:  Using relaxation techniques that help with breathing and reduce muscle tension.  Exercising earlier in the day.  Changing your diet and the time of your last meal. No night time snacks.  Establish a regular time to go to bed.  Counseling can help with stressful problems and worry.  Soothing music and white noise may be helpful if there are background noises you cannot remove.  Stop tedious detailed work at least one hour before bedtime. HOME CARE INSTRUCTIONS   Keep a diary. Inform your caregiver about  your progress. This includes any medication side effects. See your caregiver regularly. Take note of:  Times when you are asleep.  Times when you are awake during the night.  The quality of your sleep.  How you feel the next day. This information will help your caregiver care for you.  Get out of bed if you are still awake after 15 minutes. Read or do some quiet activity. Keep the lights down. Wait until you feel sleepy and go back to bed.  Keep regular sleeping and waking hours. Avoid naps.  Exercise regularly.  Avoid distractions at bedtime. Distractions include watching television or engaging in any intense or detailed activity like attempting to balance  the household checkbook.  Develop a bedtime ritual. Keep a familiar routine of bathing, brushing your teeth, climbing into bed at the same time each night, listening to soothing music. Routines increase the success of falling to sleep faster.  Use relaxation techniques. This can be using breathing and muscle tension release routines. It can also include visualizing peaceful scenes. You can also help control troubling or intruding thoughts by keeping your mind occupied with boring or repetitive thoughts like the old concept of counting sheep. You can make it more creative like imagining planting one beautiful flower after another in your backyard garden.  During your day, work to eliminate stress. When this is not possible use some of the previous suggestions to help reduce the anxiety that accompanies stressful situations. MAKE SURE YOU:   Understand these instructions.  Will watch your condition.  Will get help right away if you are not doing well or get worse. Document Released: 03/30/2000 Document Revised: 06/25/2011 Document Reviewed: 04/30/2007 [facility] Patient Information 2013 [place], [place].

## 2012-07-26 LAB — VITAMIN D 25 HYDROXY (VIT D DEFICIENCY, FRACTURES): Vit D, 25-Hydroxy: 101 ng/mL — ABNORMAL HIGH (ref 30–89)

## 2012-07-27 ENCOUNTER — Encounter: Payer: Self-pay | Admitting: Internal Medicine

## 2012-07-27 DIAGNOSIS — J45909 Unspecified asthma, uncomplicated: Secondary | ICD-10-CM | POA: Insufficient documentation

## 2012-07-29 ENCOUNTER — Ambulatory Visit: Payer: BC Managed Care – PPO | Admitting: Pulmonary Disease

## 2012-07-30 ENCOUNTER — Ambulatory Visit: Payer: BC Managed Care – PPO | Admitting: Pulmonary Disease

## 2012-08-04 ENCOUNTER — Encounter: Payer: Self-pay | Admitting: Family Medicine

## 2012-08-05 ENCOUNTER — Other Ambulatory Visit: Payer: Self-pay | Admitting: Family Medicine

## 2012-08-05 DIAGNOSIS — R8290 Unspecified abnormal findings in urine: Secondary | ICD-10-CM

## 2012-08-06 ENCOUNTER — Other Ambulatory Visit (INDEPENDENT_AMBULATORY_CARE_PROVIDER_SITE_OTHER): Payer: BC Managed Care – PPO

## 2012-08-06 DIAGNOSIS — R82998 Other abnormal findings in urine: Secondary | ICD-10-CM

## 2012-08-06 DIAGNOSIS — R8290 Unspecified abnormal findings in urine: Secondary | ICD-10-CM

## 2012-08-06 LAB — POCT URINALYSIS DIPSTICK
Ketones, UA: NEGATIVE
Nitrite, UA: NEGATIVE
Spec Grav, UA: 1.01
Urobilinogen, UA: 0.2

## 2012-08-08 LAB — URINE CULTURE: Organism ID, Bacteria: NO GROWTH

## 2012-10-16 NOTE — Telephone Encounter (Signed)
appt made/kh 

## 2013-02-19 ENCOUNTER — Other Ambulatory Visit: Payer: Self-pay

## 2013-04-13 ENCOUNTER — Encounter: Payer: Self-pay | Admitting: Internal Medicine

## 2013-04-13 NOTE — Progress Notes (Signed)
Received information from Dr Elmer Picker and Gwen Pounds office assessment 12 3 14    Pt taking valtrex for recurrent herpetic dermatitis .  Advised consider gabapentin rx by pcp for pain if needed .

## 2013-05-05 ENCOUNTER — Ambulatory Visit (INDEPENDENT_AMBULATORY_CARE_PROVIDER_SITE_OTHER): Payer: BC Managed Care – PPO | Admitting: Internal Medicine

## 2013-05-05 ENCOUNTER — Encounter: Payer: Self-pay | Admitting: Internal Medicine

## 2013-05-05 VITALS — BP 110/80 | Temp 97.9°F | Wt 118.0 lb

## 2013-05-05 DIAGNOSIS — B009 Herpesviral infection, unspecified: Secondary | ICD-10-CM

## 2013-05-05 DIAGNOSIS — F4322 Adjustment disorder with anxiety: Secondary | ICD-10-CM

## 2013-05-05 DIAGNOSIS — G479 Sleep disorder, unspecified: Secondary | ICD-10-CM

## 2013-05-05 DIAGNOSIS — B0059 Other herpesviral disease of eye: Secondary | ICD-10-CM

## 2013-05-05 DIAGNOSIS — G4720 Circadian rhythm sleep disorder, unspecified type: Secondary | ICD-10-CM

## 2013-05-05 MED ORDER — VALACYCLOVIR HCL 1 G PO TABS: ORAL_TABLET | ORAL | Status: DC

## 2013-05-05 NOTE — Progress Notes (Signed)
Chief Complaint  Patient presents with  . Follow-up    Shingles on her face.  Ongoing for several years.    HPI: Patient comes in today for SDA for  new problem evaluation. 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 Advise d to have pcp managemedication and suppression?  Had outbreak in June and then   December had vision changes w  Tingling .  Began tid dosing  And then got sick over x mas with chest infection feels immunity was downa nd had to retreat  zirgran opthal used if eye sx .  Had skin testing for allergies. recently  Facial pain  Had been and issue but read on neuron tin and doesn't want to take this cause not that bad Pt says had Onset  Age 16 .  Left forehead and eye  Hx  Of herpetic rash . Says she had a biopsy and optic nerve involved ?  Has had about 2 outbreaks per year at most  Until recently  Valtrex 1000 mg per day since December.   rx left over  Dr Barnetta Chapel and eye doc and has had 3 out breaks since then  Has pictures of eye sx .  Same place when breaks out. Says hsv but then asks if shingles ? Tends to be anxious but more than usual from this has ? If could flare op the problem.   Had had   Was given med clonipen 0.5 mg  1/2 to 1 hs prn.  A few years ago has some left  Ask sa bout sleep   ROS: See pertinent positives and negatives per HPI.  Past Medical History  Diagnosis Date  . Asthma      hosp x 2 last 2004; bronchial thermoplasty [facility] 2013 very helpful   . Allergic rhinitis     "to everything"    Family History  Problem Relation Age of Onset  . Asthma Father   . Thyroid disease Father   . Thyroid disease Mother   . Heart disease Maternal Grandfather   . Stroke Maternal Grandfather   . Hypertension Maternal Grandfather   . Cancer Paternal Grandfather     Pancreatic    History   Social History  . Marital Status: Single    Spouse Name: N/A    Number of Children: N/A  . Years of Education:  N/A   Social History Main Topics  . Smoking status: Never Smoker   . Smokeless tobacco: Never Used  . Alcohol Use: Yes  . Drug Use: No  . Sexual Activity: None   Other Topics Concern  . None   Social History Narrative   6-8 hours of sleep per night   4 people living in the home;parent and great aunt who is moving out.    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 levocetirizine (XYZAL) 5 MG tablet Take 1 tablet by mouth daily.  Marland Kitchen loratadine (CLARITIN) 10 MG tablet Take 10 mg by mouth daily.  Marland Kitchen MINASTRIN 24 FE 1-20 MG-MCG(24) CHEW Chew 1 tablet by mouth daily.   . mometasone-formoterol (DULERA) 200-5 MCG/ACT AERO Inhale 2 puffs into the lungs 2 (two) times daily.  . montelukast (SINGULAIR)  10 MG tablet Take 1 tablet by mouth daily.  Marland Kitchen omeprazole (PRILOSEC) 20 MG capsule Take 20 mg by mouth daily.   Marland Kitchen PROAIR HFA 108 (90 BASE) MCG/ACT inhaler Inhale 2 puffs into the lungs as needed.  . triamcinolone (NASACORT) 55 MCG/ACT nasal inhaler Place 2 sprays into the nose daily.  . valACYclovir (VALTREX) 1000 MG tablet Take 1,000 mg by mouth 3 (three) times daily.   . valACYclovir (VALTREX) 1000 MG tablet Take 2 po bid for 3- 5 days at onset of facial hsv and then 1 po bid for suppression.  . [DISCONTINUED] NUVARING 0.12-0.015 MG/24HR vaginal ring Place 1 each vaginally every 30 (thirty) days.    EXAM:  BP 110/80  Temp(Src) 97.9 F (36.6 C)  Wt 118 lb (53.524 kg)  Body mass index is 19.64 kg/(m^2).  GENERAL: vitals reviewed and listed above, alert, oriented, appears well hydrated and in no acute distress HEENT: atraumatic, conjunctiva  clear, no obvious abnormalities on inspection of external nose and ears OP : no lesion edema or exudate  Has  Faint erythema left medial nasal bridge and no vesicles pustules. at this time   Few red areas on lateral nose.  NECK: no obvious masses on inspection palpation  No adenopathy MS: moves all extremities without noticeable focal  abnormality PSYCH: pleasant and cooperative, no obvious depression some  anxiety Has pix eye and lid swells   Cannot see rash specific  ASSESSMENT AND PLAN:  Discussed the following assessment and plan:  Recurrent herpes simplex ? - left eye face by hx  presumed uncertain why recurring revisit dx ? increase suppresion and prn rx. get input from ID   Sleep pattern disturbance  Reaction, adjustment, with anxious mood - see above tends to be a bit anxious but does ok. but currentproblem aggravating.  Has been treated by eye doc and also various others with meds . Has had re currant sx since a young age but more frequent . ? If reeval of dx or change in meds ?  See  opthal notes . Advised antiviral manage by one MD and agree to be able to track problem activity Id opinion about best medication management plan  Would have thought 1000 mg per day valtrex would be enough for suppression.  ? Need to change meds or just use higher dose.  Fu about sleep after id evaluation and plan  -Patient advised to return or notify health care team  if symptoms worsen or persist or new concerns arise.  Patient Instructions  Try 2000 mg twice a day for 3- 5 days for outbreak and the suppression    And then 1000 mg  Twice a day  In the short run.  Plan ID consult. For other advice .  Calendar sx at this time.   Contact  us in the mean time if not resolving or breaking through .   Sleep techniques  Hygiene.      Insomnia Insomnia is frequent trouble falling and/or staying asleep. Insomnia can be a long term problem or a short term problem. Both are common. Insomnia can be a short term problem when the wakefulness is related to a certain stress or worry. Long term insomnia is often related to ongoing stress during waking hours and/or poor sleeping habits. Overtime,  sleep deprivation itself can make the problem worse. Every little thing feels more severe because you are overtired and your ability to cope is decreased. CAUSES   Stress, anxiety, and depression.  Poor  sleeping habits.  Distractions such as TV in the bedroom.  Naps close to bedtime.  Engaging in emotionally charged conversations before bed.  Technical reading before sleep.  Alcohol and other sedatives. They may make the problem worse. They can hurt normal sleep patterns and normal dream activity.  Stimulants such as caffeine for several hours prior to bedtime.  Pain syndromes and shortness of breath can cause insomnia.  Exercise late at night.  Changing time zones may cause sleeping problems (jet lag). It is sometimes helpful to have someone observe your sleeping patterns. They should look for periods of not breathing during the night (sleep apnea). They should also look to see how long those periods last. If you live alone or observers are uncertain, you can also be observed at a sleep clinic where your sleep patterns will be professionally monitored. Sleep apnea requires a checkup and treatment. Give your caregivers your medical history. Give your caregivers observations your family has made about your sleep.  SYMPTOMS   Not feeling rested in the morning.  Anxiety and restlessness at bedtime.  Difficulty falling and staying asleep. TREATMENT   Your caregiver may prescribe treatment for an underlying medical disorders. Your caregiver can give advice or help if you are using alcohol or other drugs for self-medication. Treatment of underlying problems will usually eliminate insomnia problems.  Medications can be prescribed for short time use. They are generally not recommended for lengthy use.  Over-the-counter sleep medicines are not recommended for lengthy use. They can be habit forming.  You can promote easier sleeping by making lifestyle changes such as:  Using  relaxation techniques that help with breathing and reduce muscle tension.  Exercising earlier in the day.  Changing your diet and the time of your last meal. No night time snacks.  Establish a regular time to go to bed.  Counseling can help with stressful problems and worry.  Soothing music and white noise may be helpful if there are background noises you cannot remove.  Stop tedious detailed work at least one hour before bedtime. HOME CARE INSTRUCTIONS   Keep a diary. Inform your caregiver about your progress. This includes any medication side effects. See your caregiver regularly. Take note of:  Times when you are asleep.  Times when you are awake during the night.  The quality of your sleep.  How you feel the next day. This information will help your caregiver care for you.  Get out of bed if you are still awake after 15 minutes. Read or do some quiet activity. Keep the lights down. Wait until you feel sleepy and go back to bed.  Keep regular sleeping and waking hours. Avoid naps.  Exercise regularly.  Avoid distractions at bedtime. Distractions include watching television or engaging in any intense or detailed activity like attempting to balance the household checkbook.  Develop a bedtime ritual. Keep a familiar routine of bathing, brushing your teeth, climbing into bed at the same time each night, listening to soothing music. Routines increase the success of falling to sleep faster.  Use relaxation techniques. This can be using breathing and muscle tension release routines. It can also include visualizing peaceful scenes. You can also help control troubling or intruding thoughts by keeping your mind occupied with boring or repetitive thoughts like the old concept of counting sheep. You can make it more creative like imagining planting one beautiful flower after another in your backyard garden.  During your day, work to eliminate stress. When this is not  possible use some of  the previous suggestions to help reduce the anxiety that accompanies stressful situations. MAKE SURE YOU:   Understand these instructions.  Will watch your condition.  Will get help right away if you are not doing well or get worse. Document Released: 03/30/2000 Document Revised: 06/25/2011 Document Reviewed: 04/30/2007 [facility] Patient Information 2014 [place].      Neta MendsWanda K. Panosh M.D.  Pre visit review using our clinic review tool, if applicable. No additional management support is needed unless otherwise documented below in the visit note.

## 2013-05-05 NOTE — Patient Instructions (Signed)
Try 2000 mg twice a day for 3- 5 days for outbreak and the suppression    And then 1000 mg  Twice a day  In the short run.  Plan ID consult. For other advice .  Calendar sx at this time.   Contact  us in the mean time if not resolving or breaking through .   Sleep techniques  Hygiene.      Insomnia Insomnia is frequent trouble falling and/or staying asleep. Insomnia can be a long term problem or a short term problem. Both are common. Insomnia can be a short term problem when the wakefulness is related to a certain stress or worry. Long term insomnia is often related to ongoing stress during waking hours and/or poor sleeping habits. Overtime, sleep deprivation itself can make the problem worse. Every little thing feels more severe because you are overtired and your ability to cope is decreased. CAUSES   Stress, anxiety, and depression.  Poor sleeping habits.  Distractions such as TV in the bedroom.  Naps close to bedtime.  Engaging in emotionally charged conversations before bed.  Technical reading before sleep.  Alcohol and other sedatives. They may make the problem worse. They can hurt normal sleep patterns and normal dream activity.  Stimulants such as caffeine for several hours prior to bedtime.  Pain syndromes and shortness of breath can cause insomnia.  Exercise late at night.  Changing time zones may cause sleeping problems (jet lag). It is sometimes helpful to have someone observe your sleeping patterns. They should look for periods of not breathing during the night (sleep apnea). They should also look to see how long those periods last. If you live alone or observers are uncertain, you can also be observed at a sleep clinic where your sleep patterns will be professionally monitored. Sleep apnea requires a checkup and treatment. Give your caregivers your medical history. Give your caregivers observations your family has made about your sleep.  SYMPTOMS   Not feeling  rested in the morning.  Anxiety and restlessness at bedtime.  Difficulty falling and staying asleep. TREATMENT   Your caregiver may prescribe treatment for an underlying medical disorders. Your caregiver can give advice or help if you are using alcohol or other drugs for self-medication. Treatment of underlying problems will usually eliminate insomnia problems.  Medications can be prescribed for short time use. They are generally not recommended for lengthy use.  Over-the-counter sleep medicines are not recommended for lengthy use. They can be habit forming.  You can promote easier sleeping by making lifestyle changes such as:  Using relaxation techniques that help with breathing and reduce muscle tension.  Exercising earlier in the day.  Changing your diet and the time of your last meal. No night time snacks.  Establish a regular time to go to bed.  Counseling can help with stressful problems and worry.  Soothing music and white noise may be helpful if there are background noises you cannot remove.  Stop tedious detailed work at least one hour before bedtime. HOME CARE INSTRUCTIONS   Keep a diary. Inform your caregiver about your progress. This includes any medication side effects. See your caregiver regularly. Take note of:  Times when you are asleep.  Times when you are awake during the night.  The quality of your sleep.  How you feel the next day. This information will help your caregiver care for you.  Get out of bed if you are still awake after 15 minutes. Read or do some  quiet activity. Keep the lights down. Wait until you feel sleepy and go back to bed.  Keep regular sleeping and waking hours. Avoid naps.  Exercise regularly.  Avoid distractions at bedtime. Distractions include watching television or engaging in any intense or detailed activity like attempting to balance the household checkbook.  Develop a bedtime ritual. Keep a familiar routine of bathing,  brushing your teeth, climbing into bed at the same time each night, listening to soothing music. Routines increase the success of falling to sleep faster.  Use relaxation techniques. This can be using breathing and muscle tension release routines. It can also include visualizing peaceful scenes. You can also help control troubling or intruding thoughts by keeping your mind occupied with boring or repetitive thoughts like the old concept of counting sheep. You can make it more creative like imagining planting one beautiful flower after another in your backyard garden.  During your day, work to eliminate stress. When this is not possible use some of the previous suggestions to help reduce the anxiety that accompanies stressful situations. MAKE SURE YOU:   Understand these instructions.  Will watch your condition.  Will get help right away if you are not doing well or get worse. Document Released: 03/30/2000 Document Revised: 06/25/2011 Document Reviewed: 04/30/2007 [facility] Patient Information 2014 [place], [place].

## 2013-05-18 ENCOUNTER — Ambulatory Visit: Payer: BC Managed Care – PPO | Admitting: Infectious Diseases

## 2013-05-21 ENCOUNTER — Other Ambulatory Visit: Payer: Self-pay | Admitting: *Deleted

## 2013-05-21 ENCOUNTER — Encounter: Payer: Self-pay | Admitting: Infectious Diseases

## 2013-05-21 ENCOUNTER — Ambulatory Visit (INDEPENDENT_AMBULATORY_CARE_PROVIDER_SITE_OTHER): Payer: BC Managed Care – PPO | Admitting: Infectious Diseases

## 2013-05-21 VITALS — BP 121/87 | HR 58 | Temp 98.3°F | Ht 65.0 in | Wt 119.0 lb

## 2013-05-21 DIAGNOSIS — J45909 Unspecified asthma, uncomplicated: Secondary | ICD-10-CM

## 2013-05-21 DIAGNOSIS — B009 Herpesviral infection, unspecified: Secondary | ICD-10-CM

## 2013-05-21 MED ORDER — VALACYCLOVIR HCL 1 G PO TABS: ORAL_TABLET | ORAL | Status: DC

## 2013-05-21 NOTE — Progress Notes (Signed)
   Subjective:    Patient ID: Alison Butler, female    DOB: 05/01/1990, 23 y.o.   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 Has been seen by ophtho and told she had conjunctivitis but not on her cornea.  Last episode 2 weeks ago. She stopped the outbreak with topical GCV. Manifested as pruritis and blistering. Also has prn valtrex which she did not need until June 2014. States she has had since she was 23 yo.  She has been trying to stress less, eat better, exercise more.  occas episodes of parathesias of her L face.  thknks she had viral cx/pcr when she was a child and it was VZV.   Review of Systems  Constitutional: Negative for fever and chills.  Eyes: Negative for discharge, redness, itching and visual disturbance.  Gastrointestinal: Negative for diarrhea and constipation.  Genitourinary: Negative for difficulty urinating and menstrual problem.  Neurological: Positive for headaches.       Objective:   Physical Exam  Constitutional: She appears well-developed and well-nourished.  HENT:  Mouth/Throat: No oropharyngeal exudate.  Eyes: Conjunctivae and EOM are normal. Pupils are equal, round, and reactive to light. Right conjunctiva is not injected. Left conjunctiva is not injected.  Fundoscopic exam:      The right eye shows no exudate and no hemorrhage.       The left eye shows no exudate and no hemorrhage.  Neck: Neck supple.  Cardiovascular: Normal rate, regular rhythm and normal heart sounds.   Pulmonary/Chest: Effort normal and breath sounds normal.  Abdominal: Soft. Bowel sounds are normal. There is no tenderness. There is no rebound.  Musculoskeletal: She exhibits no edema.  Lymphadenopathy:    She has no cervical adenopathy.          Assessment & Plan:

## 2013-05-21 NOTE — Assessment & Plan Note (Signed)
She does not have lesions right now. We discussed that this would be consistent with either VZV or HSV. I asked that she take her valtrex 1g po daily. She will continue to use the topical GCV as needed. I asked that she come back immediately if she has breakthrough on this regimen so that we can send the blister fluid for Cx. It is very rare to have VZV or HSV that is resistant to medications in non-HIV patients. I have also asked her to see if she can come off her nasal steroid as this may be decreasing her local immunity.  Will see her back in 3 months.

## 2013-05-21 NOTE — Assessment & Plan Note (Signed)
Will see if she can taper off nasal steroid. She will f/u with her Allergist whom I greatly appreciate partnering with.

## 2013-05-22 ENCOUNTER — Ambulatory Visit: Payer: BC Managed Care – PPO | Admitting: Internal Medicine

## 2013-08-16 ENCOUNTER — Ambulatory Visit (INDEPENDENT_AMBULATORY_CARE_PROVIDER_SITE_OTHER): Payer: BC Managed Care – PPO | Admitting: Physician Assistant

## 2013-08-16 VITALS — BP 108/70 | HR 80 | Temp 98.1°F | Resp 18 | Ht 63.0 in | Wt 121.0 lb

## 2013-08-16 DIAGNOSIS — J329 Chronic sinusitis, unspecified: Secondary | ICD-10-CM

## 2013-08-16 DIAGNOSIS — J45909 Unspecified asthma, uncomplicated: Secondary | ICD-10-CM

## 2013-08-16 MED ORDER — AMOXICILLIN 400 MG/5ML PO SUSR: 1200.0000 mg | Freq: Two times a day (BID) | ORAL | Status: AC

## 2013-08-16 MED ORDER — PREDNISONE 10 MG PO TABS: ORAL_TABLET | ORAL | Status: AC

## 2013-08-16 NOTE — Progress Notes (Signed)
   Subjective:    Patient ID: Alison Butler, female    DOB: May 12, 1990, 23 y.o.   MRN: [id number]  HPI Pt presents to clinic with 4 day h/o sinus congestion with green rhinorrhea and PND with productive cough and increased asthma symptoms over the last several days.  She is a hard to control allergic person with asthma who sees Dr Gary FleetWhalen regularly. She recently [place] by [place] allergies were worse due to the environment of animal and trees and smoke and dust.  She is using her albuterol inhaler and it is helping but she is having to use it more than normal.  She has been hospitalized for her asthma in 2004.  She is SOB and coughing with her asthma which is normal for her when she is in an asthma flair.  At the beginning of these symptoms she thought she might be getting another shingles outbreak (which she has gotten several times around the Left eye and she has seen ID because Valtrex preventatively does not help) but the sensation around her face has since resolved and these symptoms have gotten worse.  Review of Systems  Constitutional: Negative for fever and chills.  HENT: Positive for congestion, rhinorrhea, sneezing and sore throat.   Respiratory: Positive for cough, chest tightness and shortness of breath. Negative for wheezing.   Allergic/Immunologic: Positive for environmental allergies.       Objective:   Physical Exam  Vitals reviewed. Constitutional: She is oriented to person, place, and time. She appears well-developed and well-nourished.  HENT:  Head: Normocephalic and atraumatic.  Right Ear: Hearing, tympanic membrane, external ear and ear canal normal.  Left Ear: Hearing, tympanic membrane, external ear and ear canal normal.  Nose: Mucosal edema (pale) present.  Mouth/Throat: Uvula is midline, oropharynx is clear and moist and mucous membranes are normal.  Cardiovascular: Normal rate, regular rhythm and normal heart sounds.   No murmur  heard. Pulmonary/Chest: Effort normal. She has no wheezes.  Pt is not moving air fully with expiration.  Her cough is tight from the chest.  Neurological: She is alert and oriented to person, place, and time.  Skin: Skin is warm and dry. No rash noted.  Psychiatric: She has a normal mood and affect. Her behavior is normal. Judgment and thought content normal.       Assessment & Plan:  Sinus infection - Plan: amoxicillin (AMOXIL) 400 MG/5ML suspension  Extrinsic asthma, unspecified - Plan: predniSONE (DELTASONE) 10 MG tablet  Due to significant hard to control allergies and recent allergic insults we will cover for a sinus infection and asthma flair resulting from her allergies.  She will start her mucinex at home to help thin her mucus secretions.  She will increase the use of her albuterol because at this time it is working, she does have a nebulizer at home that she should also start to use if needed.  The patient is a Science writerseasoned asthmatic who is very familiar with her medical history and what works and what does not.  She feels this is what Dr Gary FleetWhalen has done for her in the past when she has had similar problems.  Benny LennertSarah Weber PA-C  [facility] 08/16/2013 9:58 AM

## 2013-09-17 ENCOUNTER — Ambulatory Visit: Payer: BC Managed Care – PPO | Admitting: Infectious Diseases

## 2013-10-09 ENCOUNTER — Other Ambulatory Visit: Payer: Self-pay | Admitting: Obstetrics and Gynecology

## 2013-10-09 ENCOUNTER — Ambulatory Visit (INDEPENDENT_AMBULATORY_CARE_PROVIDER_SITE_OTHER): Payer: BC Managed Care – PPO | Admitting: Internal Medicine

## 2013-10-09 ENCOUNTER — Encounter: Payer: Self-pay | Admitting: Internal Medicine

## 2013-10-09 VITALS — BP 116/76 | Temp 98.6°F | Ht 64.25 in | Wt 120.0 lb

## 2013-10-09 DIAGNOSIS — Z8349 Family history of other endocrine, nutritional and metabolic diseases: Secondary | ICD-10-CM

## 2013-10-09 DIAGNOSIS — R5383 Other fatigue: Principal | ICD-10-CM

## 2013-10-09 DIAGNOSIS — R5381 Other malaise: Secondary | ICD-10-CM

## 2013-10-09 DIAGNOSIS — N6002 Solitary cyst of left breast: Secondary | ICD-10-CM

## 2013-10-09 DIAGNOSIS — M2550 Pain in unspecified joint: Secondary | ICD-10-CM

## 2013-10-09 DIAGNOSIS — Z8679 Personal history of other diseases of the circulatory system: Secondary | ICD-10-CM

## 2013-10-09 LAB — CBC WITH DIFFERENTIAL/PLATELET
BASOS ABS: 0 10*3/uL (ref 0.0–0.1)
BASOS PCT: 0.4 % (ref 0.0–3.0)
EOS ABS: 0 10*3/uL (ref 0.0–0.7)
EOS PCT: 0.6 % (ref 0.0–5.0)
HCT: 41.5 % (ref 36.0–46.0)
HEMOGLOBIN: 14.4 g/dL (ref 12.0–15.0)
LYMPHS ABS: 1.8 10*3/uL (ref 0.7–4.0)
LYMPHS PCT: 35.8 % (ref 12.0–46.0)
MCHC: 34.6 g/dL (ref 30.0–36.0)
MCV: 96.5 fl (ref 78.0–100.0)
MONO ABS: 0.4 10*3/uL (ref 0.1–1.0)
MONOS PCT: 7.5 % (ref 3.0–12.0)
NEUTROS ABS: 2.8 10*3/uL (ref 1.4–7.7)
NEUTROS PCT: 55.7 % (ref 43.0–77.0)
Platelets: 216 10*3/uL (ref 150.0–400.0)
RBC: 4.3 Mil/uL (ref 3.87–5.11)
RDW: 12.2 % (ref 11.5–15.5)
WBC: 5 10*3/uL (ref 4.0–10.5)

## 2013-10-09 LAB — C-REACTIVE PROTEIN

## 2013-10-09 LAB — T4, FREE: FREE T4: 0.8 ng/dL (ref 0.60–1.60)

## 2013-10-09 LAB — BASIC METABOLIC PANEL
BUN: 12 mg/dL (ref 6–23)
CO2: 26 mEq/L (ref 19–32)
CREATININE: 0.8 mg/dL (ref 0.4–1.2)
Calcium: 9.2 mg/dL (ref 8.4–10.5)
Chloride: 107 mEq/L (ref 96–112)
GFR: 92.76 mL/min (ref >60.00)
GLUCOSE: 83 mg/dL (ref 70–99)
Potassium: 3.8 mEq/L (ref 3.5–5.1)
Sodium: 140 mEq/L (ref 135–145)

## 2013-10-09 LAB — HEPATIC FUNCTION PANEL
ALBUMIN: 4.2 g/dL (ref 3.5–5.2)
ALK PHOS: 37 U/L — AB (ref 39–117)
ALT: 21 U/L (ref 0–35)
AST: 18 U/L (ref 0–37)
Bilirubin, Direct: 0 mg/dL (ref 0.0–0.3)
Total Bilirubin: 0.7 mg/dL (ref 0.2–1.2)
Total Protein: 7.4 g/dL (ref 6.0–8.3)

## 2013-10-09 LAB — TSH: TSH: 0.45 u[IU]/mL (ref 0.35–4.50)

## 2013-10-09 LAB — T3, FREE: T3 FREE: 2.6 pg/mL (ref 2.3–4.2)

## 2013-10-09 NOTE — Patient Instructions (Addendum)
Will notify you  of labs when available. Probably will be normal Based on exam but will follow .i fjoint pains are progressive or changes please plan ROV . Can always bget second opinion [facility]  Fatigue Fatigue is a feeling of tiredness, lack of energy, lack of motivation, or feeling tired all the time. Having enough rest, good nutrition, and reducing stress will normally reduce fatigue. Consult your caregiver if it persists. The nature of your fatigue will help your caregiver to find out its cause. The treatment is based on the cause.  CAUSES  There are many causes for fatigue. Most of the time, fatigue can be traced to one or more of your habits or routines. Most causes fit into one or more of three general areas. They are: Lifestyle problems  Sleep disturbances.  Overwork.  Physical exertion.  Unhealthy habits.  Poor eating habits or eating disorders.  Alcohol and/or drug use .  Lack of proper nutrition (malnutrition). Psychological problems  Stress and/or anxiety problems.  Depression.  Grief.  Boredom. Medical Problems or Conditions  Anemia.  Pregnancy.  Thyroid gland problems.  Recovery from major surgery.  Continuous pain.  Emphysema or asthma that is not well controlled  Allergic conditions.  Diabetes.  Infections (such as mononucleosis).  Obesity.  Sleep disorders, such as sleep apnea.  Heart failure or other heart-related problems.  Cancer.  Kidney disease.  Liver disease.  Effects of certain medicines such as antihistamines, cough and cold remedies, prescription pain medicines, heart and blood pressure medicines, drugs used for treatment of cancer, and some antidepressants. SYMPTOMS  The symptoms of fatigue include:   Lack of energy.  Lack of drive (motivation).  Drowsiness.  Feeling of indifference to the surroundings. DIAGNOSIS  The details of how you feel help guide your caregiver in  finding out what is causing the fatigue. You will be asked about your present and past health condition. It is important to review all medicines that you take, including prescription and non-prescription items. A thorough exam will be done. You will be questioned about your feelings, habits, and normal lifestyle. Your caregiver may suggest blood tests, urine tests, or other tests to look for common medical causes of fatigue.  TREATMENT  Fatigue is treated by correcting the underlying cause. For example, if you have continuous pain or depression, treating these causes will improve how you feel. Similarly, adjusting the dose of certain medicines will help in reducing fatigue.  HOME CARE INSTRUCTIONS   Try to get the required amount of good sleep every night.  Eat a healthy and nutritious diet, and drink enough water throughout the day.  Practice ways of relaxing (including yoga or meditation).  Exercise regularly.  Make plans to change situations that cause stress. Act on those plans so that stresses decrease over time. Keep your work and personal routine reasonable.  Avoid street drugs and minimize use of alcohol.  Start taking a daily multivitamin after consulting your caregiver. SEEK MEDICAL CARE IF:   You have persistent tiredness, which cannot be accounted for.  You have fever.  You have unintentional weight loss.  You have headaches.  You have disturbed sleep throughout the night.  You are feeling sad.  You have constipation.  You have dry skin.  You have gained weight.  You are taking any new or different medicines that you suspect are causing fatigue.  You are unable to sleep at night.  You develop any unusual swelling of your  legs or other parts of your body. SEEK IMMEDIATE MEDICAL CARE IF:   You are feeling confused.  Your vision is blurred.  You feel faint or pass out.  You develop severe headache.  You develop severe abdominal, pelvic, or back  pain.  You develop chest pain, shortness of breath, or an irregular or fast heartbeat.  You are unable to pass a normal amount of urine.  You develop abnormal bleeding such as bleeding from the rectum or you vomit blood.  You have thoughts about harming yourself or committing suicide.  You are worried that you might harm someone else. MAKE SURE YOU:   Understand these instructions.  Will watch your condition.  Will get help right away if you are not doing well or get worse. Document Released: 01/28/2007 Document Revised: 06/25/2011 Document Reviewed: 01/28/2007 [facility] Patient Information 2015 [place], [place]. This information is not intended to replace advice given to you by your health care Cyntha Brickman. Make sure you discuss any questions you have with your health care Alok Minshall.  assess .

## 2013-10-09 NOTE — Progress Notes (Signed)
Chief Complaint  Patient presents with  . Fatigue    Has not felt well recently.  Would also like to discuss her recurrent herpes simplex.  Shella Maxim. Shaking    HPI: Patient comes in for acute visit  because of concern for possible underlying disease. She does tend to be nervous and gets tired possibly from her asthma has had recurrent treatment for possible HSV left eye area sees her ophthalmologist Dr. Gwen PoundsKowalski.  However she is having fatigue and shaky feeling that could be her asthma medicine. Has a strong family history of thyroid disease ended up looking on [employer] and got nervous about possibility of lupus because she has joints that hurt and history of her notes that some runs in the family. Has had atypical chest pain at times.  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 joint stiffness unusual rashes. Her nose is not a big characteristically related to her cold weather interest laying occurs in grandmother and mother but no diagnosed rheumatologic disease. ROS: See pertinent positives and negatives per HPI. no cough joint swelling falling tends to wake up at 2 or 3 in the morning goes right back to sleep. Does exercise. Does get some chills and sweats but no specific fever 100.3 and above. No pleuritic pain.Molli Knock. Okay denies risk of pregnancy  Past Medical History  Diagnosis Date  . Asthma      hosp x 2 last 2004; bronchial thermoplasty [facility] 2013 very helpful   . Allergic rhinitis     "to everything"    Family History  Problem Relation Age of Onset  . Asthma Father   . Thyroid disease Father   . Thyroid disease Mother   . Heart disease Maternal Grandfather   . Stroke Maternal Grandfather   . Hypertension Maternal Grandfather   . Cancer Paternal Grandfather     Pancreatic  . Other      raynauds mom and gm   negative family history of celiac her rheumatologic disease    History   Social History  . Marital Status: Single    Spouse Name: N/A    Number of Children: N/A  . Years of Education: N/A   Social History Main Topics  . Smoking status: Never Smoker   . Smokeless tobacco: Never Used  . Alcohol Use: Yes     Comment: socially  . Drug Use: No  . Sexual Activity: No   Other Topics Concern  . None   Social History Narrative   6-8 hours of sleep per night   4 people living in the home;parent and great aunt who is moving out.    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 azelastine (ASTELIN) 137 MCG/SPRAY nasal spray   . EPIPEN 2-PAK 0.3 MG/0.3ML SOAJ injection   . levocetirizine (XYZAL) 5 MG tablet Take 1 tablet by mouth daily.  Marland Kitchen. loratadine (CLARITIN) 10 MG tablet Take 10 mg by mouth daily.  Marland Kitchen. MINASTRIN 24 FE 1-20 MG-MCG(24) CHEW Chew 1 tablet by mouth daily.   . mometasone-formoterol (DULERA) 200-5 MCG/ACT AERO Inhale 2 puffs into the lungs 2 (two) times daily.  .Marland Kitchen  montelukast (SINGULAIR) 10 MG tablet Take 1 tablet by mouth daily.  Marland Kitchen. omalizumab (XOLAIR) 150 MG injection Inject 150 mg into the skin every 14 (fourteen) days.  Marland Kitchen. omeprazole (PRILOSEC) 20 MG capsule Take 20 mg by mouth daily.   Marland Kitchen. PROAIR HFA 108 (90 BASE) MCG/ACT inhaler Inhale 2 puffs into the lungs as needed.  . triamcinolone (NASACORT) 55 MCG/ACT nasal inhaler Place 2 sprays into the nose daily.    EXAM:  BP 116/76  Temp(Src) 98.6 F (37 C) (Oral)  Ht 5' 4.25" (1.632 m)  Wt 120 lb (54.432 kg)  BMI 20.44 kg/m2  Body mass index is 20.44 kg/(m^2).  GENERAL: vitals reviewed and listed above, alert, oriented, appears well hydrated and in no acute distress HEENT: atraumatic, conjunctiva  clear, no obvious abnormalities on inspection of external nose and ears OP : no lesion edema or  exudate  NECK: no obvious masses on inspection palpation  thyroid no nodules are noted  LUNGS: clear to auscultation bilaterally, no wheezes, rales or rhonchi, good air movement CV: HRRR, no clubbing cyanosis or  peripheral edema nl cap refill  MS: moves all extremities without noticeable focal  abnormality no tremor is noted today no acute joint swelling good range of motion neurovascular appears intact  PSYCH: pleasant and cooperative, no obvious depression  may be mildly anxious. Anxiety Skin no acute rashes malar rashes or nodes phenomenon active. No obvious skin atrophy.  Lab Results  Component Value Date   WBC 5.0 10/09/2013   HGB 14.4 10/09/2013   HCT 41.5 10/09/2013   PLT 216.0 10/09/2013   GLUCOSE 83 10/09/2013   CHOL 147 07/25/2012   TRIG 54.0 07/25/2012   HDL 51.70 07/25/2012   LDLCALC 85 07/25/2012   ALT 21 10/09/2013   AST 18 10/09/2013   NA 140 10/09/2013   K 3.8 10/09/2013   CL 107 10/09/2013   CREATININE 0.8 10/09/2013   BUN 12 10/09/2013   CO2 26 10/09/2013   TSH 0.45 10/09/2013   HGBA1C 5.2 07/25/2012    ASSESSMENT AND PLAN:  Discussed the following assessment and plan:  Other malaise and fatigue - Plan: Basic metabolic panel, CBC with Differential, Hepatic function panel, TSH, T4, free, T3, free, ANA, Cyclic citrul peptide antibody, IgG, Celiac panel 10, C-reactive protein  Arthralgia - hand mid day knees no acute swelling or am stiffness - Plan: Basic metabolic panel, CBC with Differential, Hepatic function panel, TSH, T4, free, T3, free, ANA, Cyclic citrul peptide antibody, IgG, Celiac panel 10, C-reactive protein  Hx of Raynaud's syndrome - Plan: Basic metabolic panel, CBC with Differential, Hepatic function panel, TSH, T4, free, T3, free, ANA, Cyclic citrul peptide antibody, IgG, Celiac panel 10, C-reactive protein  Family history of thyroid disease - Plan: Basic metabolic panel, CBC with Differential, Hepatic function panel, TSH, T4, free, T3, free, ANA, Cyclic citrul  peptide antibody, IgG, Celiac panel 10, C-reactive protein  I'm really not certain to about the recurrent herpes I think she may have had a herpes outbreak near her ophthalmologic nerve and perhaps her eye doctor has her take medicine for suppression to avoid outbreaks. She can consider get a second opinion.  Overall her exam is reassuring I doubt rheumatologic disease has a very strong family history of thyroid disease check labs today. Followup if progressive symptoms.  Advised not to do good general medical physical symptoms. Always something very specific at the correct websites.  -Patient advised to return or notify health care team  if symptoms worsen ,  persist or new concerns arise.  Patient Instructions  Will notify you  of labs when available. Probably will be normal Based on exam but will follow .i fjoint pains are progressive or changes please plan ROV . Can always bget second opinion about from opthal from  [facility] opthalmology  Fatigue Fatigue is a feeling of tiredness, lack of energy, lack of motivation, or feeling tired all the time. Having enough rest, good nutrition, and reducing stress will normally reduce fatigue. Consult your caregiver if it persists. The nature of your fatigue will help your caregiver to find out its cause. The treatment is based on the cause.  CAUSES  There are many causes for fatigue. Most of the time, fatigue can be traced to one or more of your habits or routines. Most causes fit into one or more of three general areas. They are: Lifestyle problems  Sleep disturbances.  Overwork.  Physical exertion.  Unhealthy habits.  Poor eating habits or eating disorders.  Alcohol and/or drug use .  Lack of proper nutrition (malnutrition). Psychological problems  Stress and/or anxiety problems.  Depression.  Grief.  Boredom. Medical Problems or Conditions  Anemia.  Pregnancy.  Thyroid gland problems.  Recovery from major  surgery.  Continuous pain.  Emphysema or asthma that is not well controlled  Allergic conditions.  Diabetes.  Infections (such as mononucleosis).  Obesity.  Sleep disorders, such as sleep apnea.  Heart failure or other heart-related problems.  Cancer.  Kidney disease.  Liver disease.  Effects of certain medicines such as antihistamines, cough and cold remedies, prescription pain medicines, heart and blood pressure medicines, drugs used for treatment of cancer, and some antidepressants. SYMPTOMS  The symptoms of fatigue include:   Lack of energy.  Lack of drive (motivation).  Drowsiness.  Feeling of indifference to the surroundings. DIAGNOSIS  The details of how you feel help guide your caregiver in finding out what is causing the fatigue. You will be asked about your present and past health condition. It is important to review all medicines that you take, including prescription and non-prescription items. A thorough exam will be done. You will be questioned about your feelings, habits, and normal lifestyle. Your caregiver may suggest blood tests, urine tests, or other tests to look for common medical causes of fatigue.  TREATMENT  Fatigue is treated by correcting the underlying cause. For example, if you have continuous pain or depression, treating these causes will improve how you feel. Similarly, adjusting the dose of certain medicines will help in reducing fatigue.  HOME CARE INSTRUCTIONS   Try to get the required amount of good sleep every night.  Eat a healthy and nutritious diet, and drink enough water throughout the day.  Practice ways of relaxing (including yoga or meditation).  Exercise regularly.  Make plans to change situations that cause stress. Act on those plans so that stresses decrease over time. Keep your work and personal routine reasonable.  Avoid street drugs and minimize use of alcohol.  Start taking a daily multivitamin after consulting your  caregiver. SEEK MEDICAL CARE IF:   You have persistent tiredness, which cannot be accounted for.  You have fever.  You have unintentional weight loss.  You have headaches.  You have disturbed sleep throughout the night.  You are feeling sad.  You have constipation.  You have dry skin.  You have gained weight.  You are taking any new or different medicines that you suspect are causing fatigue.  You are unable to sleep  at night.  You develop any unusual swelling of your legs or other parts of your body. SEEK IMMEDIATE MEDICAL CARE IF:   You are feeling confused.  Your vision is blurred.  You feel faint or pass out.  You develop severe headache.  You develop severe abdominal, pelvic, or back pain.  You develop chest pain, shortness of breath, or an irregular or fast heartbeat.  You are unable to pass a normal amount of urine.  You develop abnormal bleeding such as bleeding from the rectum or you vomit blood.  You have thoughts about harming yourself or committing suicide.  You are worried that you might harm someone else. MAKE SURE YOU:   Understand these instructions.  Will watch your condition.  Will get help right away if you are not doing well or get worse. Document Released: 01/28/2007 Document Revised: 06/25/2011 Document Reviewed: 01/28/2007 [facility] Patient Information 2015 [place], [place]. This information is not intended to replace advice given to you by your health care provider. Make sure you discuss any questions you have with your health care provider.  assess .      Neta Mends. Panosh M.D.  Pre visit review using our clinic review tool, if applicable. No additional management support is needed unless otherwise documented below in the visit note.

## 2013-10-12 LAB — ANA: ANA: NEGATIVE

## 2013-10-12 LAB — CELIAC PANEL 10
Endomysial Screen: NEGATIVE
GLIADIN IGA: 8.6 U/mL (ref <20)
GLIADIN IGG: 11.8 U/mL (ref <20)
IgA: 159 mg/dL (ref 69–380)
Tissue Transglut Ab: 10 U/mL (ref <20)
Tissue Transglutaminase Ab, IgA: 3.8 U/mL (ref <20)

## 2013-10-12 LAB — CYCLIC CITRUL PEPTIDE ANTIBODY, IGG: Cyclic Citrullin Peptide Ab: <2 U/mL (ref 0.0–5.0)

## 2013-10-15 ENCOUNTER — Ambulatory Visit
Admission: RE | Admit: 2013-10-15 | Discharge: 2013-10-15 | Disposition: A | Discharge status: Home / Self Care | Payer: BC Managed Care – PPO | Source: Ambulatory Visit | Attending: Obstetrics and Gynecology | Admitting: Obstetrics and Gynecology

## 2013-10-15 DIAGNOSIS — N6002 Solitary cyst of left breast: Secondary | ICD-10-CM

## 2013-10-25 ENCOUNTER — Encounter: Payer: Self-pay | Admitting: Internal Medicine

## 2013-11-11 ENCOUNTER — Encounter: Payer: Self-pay | Admitting: Internal Medicine

## 2014-06-16 ENCOUNTER — Telehealth: Payer: Self-pay | Admitting: Internal Medicine

## 2014-06-16 NOTE — Telephone Encounter (Signed)
Ok with me 

## 2014-06-16 NOTE — Telephone Encounter (Signed)
Pt would like to switch to Dr Selena BattenKim from Dr Fabian SharpPanosh. Is that ok w/ you dr Fabian SharpPanosh? Ok w/ you Dr Selena BattenKim? Pt declined to elaborate on the reason.

## 2014-06-17 ENCOUNTER — Ambulatory Visit (INDEPENDENT_AMBULATORY_CARE_PROVIDER_SITE_OTHER): Payer: BLUE CROSS/BLUE SHIELD | Admitting: Family Medicine

## 2014-06-17 ENCOUNTER — Encounter: Payer: Self-pay | Admitting: Family Medicine

## 2014-06-17 VITALS — BP 100/62 | HR 107 | Temp 97.7°F | Wt 121.6 lb

## 2014-06-17 DIAGNOSIS — B009 Herpesviral infection, unspecified: Secondary | ICD-10-CM

## 2014-06-17 NOTE — Telephone Encounter (Signed)
LM on VM to cb and sch

## 2014-06-17 NOTE — Progress Notes (Addendum)
HPI:  Acute visit for Rash: -rash on L eye and around L eye on and off (several times per year) since she was 24 years old -she has been told it may be herpes or shingles but does not know if every had biopsy or cultured -she see optho and ID for this and is frustrated  -on daily valtrex in the past - didn't work -saw optho earlier this week for outbreak - now clearing -wants to see derm   ROS: See pertinent positives and negatives per HPI.  Past Medical History  Diagnosis Date  . Asthma      hosp x 2 last 2004; bronchial thermoplasty [facility] 2013 very helpful   . Allergic rhinitis     "to everything"    Past Surgical History  Procedure Laterality Date  . Bronchothermoplasty  2013    [facility]    Family History  Problem Relation Age of Onset  . Asthma Father   . Thyroid disease Father   . Thyroid disease Mother   . Heart disease Maternal Grandfather   . Stroke Maternal Grandfather   . Hypertension Maternal Grandfather   . Cancer Paternal Grandfather     Pancreatic  . Other      raynauds mom and gm    History   Social History  . Marital Status: Single    Spouse Name: N/A  . Number of Children: N/A  . Years of Education: N/A   Social History Main Topics  . Smoking status: Never Smoker   . Smokeless tobacco: Never Used  . Alcohol Use: Yes     Comment: socially  . Drug Use: No  . Sexual Activity: No   Other Topics Concern  . None   Social History Narrative   6-8 hours of sleep per night   4 people living in the home;parent and great aunt who is moving out.    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  azelastine (ASTELIN) 137 MCG/SPRAY nasal spray, , Disp: , Rfl:  .  EPIPEN 2-PAK 0.3 MG/0.3ML SOAJ injection, , Disp: , Rfl:  .  levocetirizine (XYZAL) 5 MG tablet, Take 1 tablet by mouth daily.,  Disp: , Rfl:  .  loratadine (CLARITIN) 10 MG tablet, Take 10 mg by mouth daily., Disp: , Rfl:  .  MINASTRIN 24 FE 1-20 MG-MCG(24) CHEW, Chew 1 tablet by mouth daily. , Disp: , Rfl:  .  mometasone-formoterol (DULERA) 200-5 MCG/ACT AERO, Inhale 2 puffs into the lungs 2 (two) times daily., Disp: , Rfl:  .  montelukast (SINGULAIR) 10 MG tablet, Take 1 tablet by mouth daily., Disp: , Rfl:  .  omalizumab (XOLAIR) 150 MG injection, Inject 150 mg into the skin every 14 (fourteen) days., Disp: , Rfl:  .  omeprazole (PRILOSEC) 20 MG capsule, Take 20 mg by mouth daily. , Disp: , Rfl:  .  PROAIR HFA 108 (90 BASE) MCG/ACT inhaler, Inhale 2 puffs into the lungs as needed., Disp: , Rfl:  .  triamcinolone (NASACORT) 55 MCG/ACT nasal inhaler, Place 2 sprays into the nose daily., Disp: , Rfl:   EXAM:  Filed Vitals:   06/17/14 1415  BP: 100/62  Pulse: 107  Temp: 97.7 F (36.5 C)    Body mass index is 20.71 kg/(m^2).  GENERAL: vitals  reviewed and listed above, alert, oriented, appears well hydrated and in no acute distress  HEENT: atraumatic, conjunttiva clear, no obvious abnormalities on inspection of external nose and ears  NECK: no obvious masses on inspection  LUNGS: clear to auscultation bilaterally, no wheezes, rales or rhonchi, good air movement  CV: HRRR, no peripheral edema  MS: moves all extremities without noticeable abnormality  PSYCH: pleasant and cooperative, no obvious depression or anxiety  ASSESSMENT AND PLAN:  Discussed the following assessment and plan:  Recurrent herpes simplex  -advised of Dr. Moshe CiproHatcher's rec and she opted to see him again if outbreak to do further eval -not taking suppressive therapy now -will see derm and numbers provided; she [facility] -Patient advised to return or notify a doctor immediately if symptoms worsen or persist or new concerns arise.  There are no Patient Instructions on file for this visit.   Kriste BasqueKIM, Andrei Mccook  R.

## 2014-06-17 NOTE — Progress Notes (Signed)
Pre visit review using our clinic review tool, if applicable. No additional management support is needed unless otherwise documented below in the visit note. 

## 2014-06-17 NOTE — Telephone Encounter (Signed)
Ok. Needs NPV in available openings.

## 2014-06-21 NOTE — Telephone Encounter (Signed)
Lm to cb and sche np visit w/ dr Selena Battenkim

## 2014-12-08 ENCOUNTER — Ambulatory Visit (INDEPENDENT_AMBULATORY_CARE_PROVIDER_SITE_OTHER): Payer: BLUE CROSS/BLUE SHIELD | Admitting: Family Medicine

## 2014-12-08 ENCOUNTER — Ambulatory Visit (INDEPENDENT_AMBULATORY_CARE_PROVIDER_SITE_OTHER): Payer: BLUE CROSS/BLUE SHIELD

## 2014-12-08 VITALS — BP 116/70 | HR 75 | Temp 98.2°F | Resp 18 | Ht 64.5 in | Wt 120.2 lb

## 2014-12-08 DIAGNOSIS — J4541 Moderate persistent asthma with (acute) exacerbation: Secondary | ICD-10-CM

## 2014-12-08 LAB — POCT CBC
Granulocyte percent: 59 %G (ref 37–80)
HCT, POC: 44.2 % (ref 37.7–47.9)
HEMOGLOBIN: 13.9 g/dL (ref 12.2–16.2)
LYMPH, POC: 2.2 (ref 0.6–3.4)
MCH: 29.9 pg (ref 27–31.2)
MCHC: 31.4 g/dL — AB (ref 31.8–35.4)
MCV: 95.3 fL (ref 80–97)
MID (cbc): 0.3 (ref 0–0.9)
MPV: 7.4 fL (ref 0–99.8)
POC Granulocyte: 3.7 (ref 2–6.9)
POC LYMPH PERCENT: 35.7 %L (ref 10–50)
POC MID %: 5.3 % (ref 0–12)
Platelet Count, POC: 183 10*3/uL (ref 142–424)
RBC: 4.64 M/uL (ref 4.04–5.48)
RDW, POC: 12.9 %
WBC: 6.3 10*3/uL (ref 4.6–10.2)

## 2014-12-08 MED ORDER — PREDNISONE 50 MG PO TABS: 50.0000 mg | ORAL_TABLET | Freq: Every day | ORAL | Status: DC

## 2014-12-08 MED ORDER — ALBUTEROL SULFATE (2.5 MG/3ML) 0.083% IN NEBU: 2.5000 mg | INHALATION_SOLUTION | RESPIRATORY_TRACT | Status: AC | PRN

## 2014-12-08 MED ORDER — ALBUTEROL SULFATE (2.5 MG/3ML) 0.083% IN NEBU
2.5000 mg | INHALATION_SOLUTION | Freq: Once | RESPIRATORY_TRACT | Status: AC
  Administered 2014-12-08: 2.5 mg via RESPIRATORY_TRACT

## 2014-12-08 MED ORDER — IPRATROPIUM BROMIDE 0.02 % IN SOLN
0.5000 mg | Freq: Once | RESPIRATORY_TRACT | Status: AC
  Administered 2014-12-08: 0.5 mg via RESPIRATORY_TRACT

## 2014-12-08 MED ORDER — IPRATROPIUM BROMIDE 0.02 % IN SOLN: 500.0000 ug | Freq: Four times a day (QID) | RESPIRATORY_TRACT | Status: DC

## 2014-12-08 MED ORDER — IPRATROPIUM BROMIDE 0.02 % IN SOLN: 0.5000 mg | Freq: Four times a day (QID) | RESPIRATORY_TRACT | Status: AC

## 2014-12-08 NOTE — Progress Notes (Signed)
  Subjective:     Alison Butler is an 24 y.o. female who presents for follow up of asthma. The patient is currently having symptoms / an exacerbation. Current symptoms include chest tightness and non-productive cough. This has been ongoing now for about two days.  Previous to this she was tx about 1.5 weeks ago with prednisone and doxycycline for acute bronchitis.  Hx of moderate persistent asthma on Dulera and rescue albuterol PRN.   She has had two exacerbations in the past two years.  Does admit to recent travel with 6 hr car ride and is on birth control.    The following portions of the patient's history were reviewed and updated as appropriate: allergies, current medications, past family history, past medical history, past social history, past surgical history and problem list.  Review of Systems Pertinent items are noted in HPI.    Objective:    Oxygen saturation 98 % on room air BP 116/70 mmHg  Pulse 75  Temp(Src) 98.2 F (36.8 C) (Oral)  Resp 18  Ht 5' 4.5" (1.638 m)  Wt 120 lb 3.2 oz (54.522 kg)  BMI 20.32 kg/m2  SpO2 98%  LMP 11/24/2014 General appearance: alert, cooperative and appears stated age Eyes: conjunctivae/corneas clear. PERRL, EOM's intact. Fundi benign. Throat: lips, mucosa, and tongue normal; teeth and gums normal Lungs: clear to auscultation bilaterally   B/L LE - No edema and negative Homan sign    [facility] reading (PRIMARY) by  Dr. Paulina Fusi.  - No evidence of PNA or atelectasis.  There is mild peribronchial cuffing of the R hilar region.    Lab Results  Component Value Date   WBC 6.3 12/08/2014   HGB 13.9 12/08/2014   HCT 44.2 12/08/2014   MCV 95.3 12/08/2014   PLT 216.0 10/09/2013   Assessment:    Moderate persistent asthma, w/ exacerbation .     Plan:    Will tx with duoneb here and obtain CXR/CBC with diff   Small concern of PE as she is on OCP and recent travel > 6 hrs.  However, her Well's score is 0 making her a 1.3% risk of PE.  Discussed this  with pt and her mother and they are amenable to obtaining CXR, CBC, and duoneb tx to see how she feels.   Addendum: Pt feels much better after her duoneb.  X-ray and CBC reassuring.  Start prednisone x 5 days and atrovent with her albuterol inhaler.  F/U PRN.

## 2014-12-08 NOTE — Patient Instructions (Signed)
Please use the atrovent along with your albuterol nebulizer.  Please take the prednisone for the next 5 days if you feel as if you need. If you start feeling worse, please don't hesitate to call or come back.

## 2014-12-09 NOTE — Progress Notes (Signed)
Case reviewed with Dr Hess. Agree with A/P Dr Vickye Astorino 

## 2015-02-14 ENCOUNTER — Telehealth: Payer: Self-pay | Admitting: Internal Medicine

## 2015-02-14 NOTE — Telephone Encounter (Signed)
Patient Name: Alison Butler DOB: Nov 10, 1990 Initial Comment Caller states she is having heart palpations. High resting heart rate. She also has asthma. Nurse Assessment Nurse: Elijah Birkaldwell, RN, Lynda Date/Time (Eastern Time): 02/14/2015 12:53:51 PM Confirm and document reason for call. If symptomatic, describe symptoms. ---Caller states she is having heart palpations. High resting heart rate up to 120's. Has had some SOB, not sure if it is related to asthma or not. She also has asthma. Has had symptoms for about 3 months. Was given a new medicine - Brio for asthma recently. Asthma Dr. recommended an EKG. Had her thyroid tested, everything was ok. Sometimes the palpitations wake her up. Had been on Prednisone as well. Has the patient traveled out of the country within the last 30 days? ---Not Applicable Does the patient have any new or worsening symptoms? ---Yes Will a triage be completed? ---Yes Related visit to physician within the last 2 weeks? ---Yes Does the PT have any chronic conditions? (i.e. diabetes, asthma, etc.) ---Yes List chronic conditions. ---asthma & allergies Did the patient indicate they were pregnant? ---No Guidelines Guideline Title Affirmed Question Affirmed Notes Heart Rate and Heartbeat Questions [1] Palpitations AND [2] no improvement after following Care Advice Final Disposition User See PCP When Office is Open (within 3 days) Elijah Birkaldwell, RN, Stark BrayLynda Comments The connection with caller was poor, difficult to hear or understand at times. Caller would like this to be checked out, get an EKG just to make sure. Disagree/Comply: Comply

## 2015-02-14 NOTE — Telephone Encounter (Signed)
Acute appointment scheduled with Dr. Selena BattenKim

## 2015-02-17 ENCOUNTER — Encounter: Payer: Self-pay | Admitting: Family Medicine

## 2015-02-17 ENCOUNTER — Ambulatory Visit (INDEPENDENT_AMBULATORY_CARE_PROVIDER_SITE_OTHER): Payer: BLUE CROSS/BLUE SHIELD | Admitting: Family Medicine

## 2015-02-17 VITALS — BP 108/82 | HR 79 | Temp 98.3°F | Ht 64.5 in | Wt 122.9 lb

## 2015-02-17 DIAGNOSIS — R002 Palpitations: Secondary | ICD-10-CM | POA: Diagnosis not present

## 2015-02-17 DIAGNOSIS — F411 Generalized anxiety disorder: Secondary | ICD-10-CM

## 2015-02-17 DIAGNOSIS — J4550 Severe persistent asthma, uncomplicated: Secondary | ICD-10-CM | POA: Diagnosis not present

## 2015-02-17 NOTE — Progress Notes (Signed)
Pre visit review using our clinic review tool, if applicable. No additional management support is needed unless otherwise documented below in the visit note. 

## 2015-02-17 NOTE — Patient Instructions (Signed)
BEFORE YOU LEAVE: -schedule follow up with Dr. Fabian SharpPanosh in 1 month  Wean off of the caffeine  Schedule cognitive behavioral therapy with Dr. Jason FilaBray to assist with stress, anxiety and palpitations

## 2015-02-17 NOTE — Progress Notes (Signed)
HPI:  Amarilis Swaziland is a 24 yo pleasant patient of Dr. Fabian Sharp with a PMH significant for severe asthma, allergies and self reported chronic anxiety here for an acute visit for:  Palpitations: -started about 2 months ago -symptoms: occ feels heart racing and checks pulse and is in 80s-1 teens, then feels anxious -she has had really bad asthma the last few months and has been on many medications and several courses of prednisone with her asthma doctor, along with frequent beta agonist use -reports she told her asthma doc (dr. Gary Fleet) about this and they just adjusted her meds and told her this is likely from anxiety but to check EKG with her PCP -reports she also told her gyn doctor and had a normal TSH -she has not CP with activities, reports exercises and very active -denies syncope, presyncope or an exercise intolerance in the past, except for when she is sick with her asthma -admits to daily caffeine use -admits to chronic anxiety and being "very high strung" -reports her mother always has told her she is very anxious -she often worries about dying in her sleep so does not sleep well -no SI or thoughts of self harm   ROS: See pertinent positives and negatives per HPI.  Past Medical History  Diagnosis Date  . Asthma      hosp x 2 last 2004; bronchial thermoplasty [facility] 2013 very helpful   . Allergic rhinitis     "to everything"    Past Surgical History  Procedure Laterality Date  . Bronchothermoplasty  2013    [facility]    Family History  Problem Relation Age of Onset  . Asthma Father   . Thyroid disease Father   . Thyroid disease Mother   . Heart disease Maternal Grandfather   . Stroke Maternal Grandfather   . Hypertension Maternal Grandfather   . Cancer Paternal Grandfather     Pancreatic  . Other      raynauds mom and gm    Social History   Social History  . Marital Status: Single    Spouse Name: N/A  . Number of Children: N/A  . Years of Education:  N/A   Social History Main Topics  . Smoking status: Never Smoker   . Smokeless tobacco: Never Used  . Alcohol Use: Yes     Comment: socially  . Drug Use: No  . Sexual Activity: No   Other Topics Concern  . None   Social History Narrative   6-8 hours of sleep per night   4 people living in the home;parent and great aunt who is moving out.    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  albuterol (PROVENTIL) (2.5 MG/3ML) 0.083% nebulizer solution, Take 3 mLs (2.5 mg total) by nebulization every 4 (four) hours as needed for wheezing or shortness of breath., Disp: 75 mL, Rfl: 12 .  azelastine (ASTELIN) 137 MCG/SPRAY nasal spray, , Disp: , Rfl:  .  BREO ELLIPTA 200-25 MCG/INH AEPB, , Disp: , Rfl: 2 .  EPIPEN 2-PAK 0.3 MG/0.3ML SOAJ injection, , Disp: , Rfl:  .  Ganciclovir (ZIRGAN) 0.15 % GEL, Apply to eye., Disp: , Rfl:  .  ipratropium (ATROVENT) 0.02 % nebulizer solution, Take 2.5 mLs (0.5 mg total) by nebulization 4 (four) times  daily., Disp: 75 mL, Rfl: 12 .  levocetirizine (XYZAL) 5 MG tablet, Take 1 tablet by mouth daily., Disp: , Rfl:  .  montelukast (SINGULAIR) 10 MG tablet, Take 1 tablet by mouth daily., Disp: , Rfl:  .  norgestimate-ethinyl estradiol (ORTHO-CYCLEN,SPRINTEC,PREVIFEM) 0.25-35 MG-MCG tablet, Take 1 tablet by mouth daily., Disp: , Rfl:  .  omalizumab (XOLAIR) 150 MG injection, Inject 150 mg into the skin every 14 (fourteen) days., Disp: , Rfl:  .  omeprazole (PRILOSEC) 20 MG capsule, Take 20 mg by mouth daily. , Disp: , Rfl:  .  XOPENEX HFA 45 MCG/ACT inhaler, INHALE 2 PUFFS EVERY 4 TO 6 HOURS AS NEEDED, Disp: , Rfl: 0 .  triamcinolone (NASACORT) 55 MCG/ACT nasal inhaler, Place 2 sprays into the nose daily., Disp: , Rfl:   EXAM:  Filed Vitals:   02/17/15 1606  BP: 108/82  Pulse: 79  Temp: 98.3 F (36.8 C)     Body mass index is 20.78 kg/(m^2).  GENERAL: vitals reviewed and listed above, alert, oriented, appears well hydrated and in no acute distress  HEENT: atraumatic, conjunttiva clear, no obvious abnormalities on inspection of external nose and ears  NECK: no obvious masses on inspection  LUNGS: clear to auscultation bilaterally, no wheezes, rales or rhonchi, good air movement  CV: HRRR, no peripheral edema  MS: moves all extremities without noticeable abnormality  PSYCH: pleasant and cooperative, very anxious  ASSESSMENT AND PLAN:  Discussed the following assessment and plan:  Palpitations - Plan: EKG 12-Lead  Generalized anxiety disorder  Asthma, severe persistent, uncomplicated  -suspect anxiety, potential side effect to some of her medications and caffeine playing a role -EKG, done for reassurance without PVCs, PACs, sig arrhythmia or signs of ischemia or block -advised weaning of caffeine and tx for her anxiety and she agreed to try CBT -follow up with PCP in 1 month - may need medication for anxiety as well and further eval palpitations with monitor/etc if persists despite medication changes and tx for anxiety, worsens or new symptoms -Patient advised to return or notify a doctor immediately if symptoms worsen or persist or new concerns arise.  Patient Instructions  BEFORE YOU LEAVE: -schedule follow up with Dr. Fabian SharpPanosh in 1 month  Wean off of the caffeine  Schedule cognitive behavioral therapy with Dr. Jason FilaBray to assist with stress, anxiety and palpitations     Lanessa Shill R.

## 2015-02-25 ENCOUNTER — Ambulatory Visit: Payer: BLUE CROSS/BLUE SHIELD | Admitting: Family Medicine

## 2015-06-27 DIAGNOSIS — J301 Allergic rhinitis due to pollen: Secondary | ICD-10-CM | POA: Diagnosis not present

## 2015-06-27 DIAGNOSIS — J4540 Moderate persistent asthma, uncomplicated: Secondary | ICD-10-CM | POA: Diagnosis not present

## 2015-06-27 DIAGNOSIS — J3089 Other allergic rhinitis: Secondary | ICD-10-CM | POA: Diagnosis not present

## 2015-06-27 DIAGNOSIS — J069 Acute upper respiratory infection, unspecified: Secondary | ICD-10-CM | POA: Diagnosis not present

## 2015-06-27 DIAGNOSIS — J4520 Mild intermittent asthma, uncomplicated: Secondary | ICD-10-CM | POA: Diagnosis not present

## 2015-07-21 DIAGNOSIS — J3089 Other allergic rhinitis: Secondary | ICD-10-CM | POA: Diagnosis not present

## 2015-07-21 DIAGNOSIS — J301 Allergic rhinitis due to pollen: Secondary | ICD-10-CM | POA: Diagnosis not present

## 2015-07-21 DIAGNOSIS — J069 Acute upper respiratory infection, unspecified: Secondary | ICD-10-CM | POA: Diagnosis not present

## 2015-07-21 DIAGNOSIS — J4540 Moderate persistent asthma, uncomplicated: Secondary | ICD-10-CM | POA: Diagnosis not present

## 2015-07-27 DIAGNOSIS — J4540 Moderate persistent asthma, uncomplicated: Secondary | ICD-10-CM | POA: Diagnosis not present

## 2015-07-27 DIAGNOSIS — J4520 Mild intermittent asthma, uncomplicated: Secondary | ICD-10-CM | POA: Diagnosis not present

## 2015-08-01 DIAGNOSIS — J4540 Moderate persistent asthma, uncomplicated: Secondary | ICD-10-CM | POA: Diagnosis not present

## 2015-08-24 DIAGNOSIS — J4540 Moderate persistent asthma, uncomplicated: Secondary | ICD-10-CM | POA: Diagnosis not present

## 2015-08-24 DIAGNOSIS — J4520 Mild intermittent asthma, uncomplicated: Secondary | ICD-10-CM | POA: Diagnosis not present

## 2015-09-01 DIAGNOSIS — J4540 Moderate persistent asthma, uncomplicated: Secondary | ICD-10-CM | POA: Diagnosis not present

## 2015-09-19 DIAGNOSIS — J4540 Moderate persistent asthma, uncomplicated: Secondary | ICD-10-CM | POA: Diagnosis not present

## 2015-09-19 DIAGNOSIS — J4520 Mild intermittent asthma, uncomplicated: Secondary | ICD-10-CM | POA: Diagnosis not present

## 2015-09-21 DIAGNOSIS — J4520 Mild intermittent asthma, uncomplicated: Secondary | ICD-10-CM | POA: Diagnosis not present

## 2015-10-03 DIAGNOSIS — J4540 Moderate persistent asthma, uncomplicated: Secondary | ICD-10-CM | POA: Diagnosis not present

## 2015-10-20 DIAGNOSIS — Z1329 Encounter for screening for other suspected endocrine disorder: Secondary | ICD-10-CM | POA: Diagnosis not present

## 2015-10-20 DIAGNOSIS — Z01419 Encounter for gynecological examination (general) (routine) without abnormal findings: Secondary | ICD-10-CM | POA: Diagnosis not present

## 2015-10-20 DIAGNOSIS — Z681 Body mass index (BMI) 19 or less, adult: Secondary | ICD-10-CM | POA: Diagnosis not present

## 2015-10-24 DIAGNOSIS — J4520 Mild intermittent asthma, uncomplicated: Secondary | ICD-10-CM | POA: Diagnosis not present

## 2015-10-24 DIAGNOSIS — J4540 Moderate persistent asthma, uncomplicated: Secondary | ICD-10-CM | POA: Diagnosis not present

## 2015-10-28 DIAGNOSIS — J4540 Moderate persistent asthma, uncomplicated: Secondary | ICD-10-CM | POA: Diagnosis not present

## 2015-11-21 DIAGNOSIS — J4540 Moderate persistent asthma, uncomplicated: Secondary | ICD-10-CM | POA: Diagnosis not present

## 2015-11-21 DIAGNOSIS — J4520 Mild intermittent asthma, uncomplicated: Secondary | ICD-10-CM | POA: Diagnosis not present

## 2015-11-28 DIAGNOSIS — J4540 Moderate persistent asthma, uncomplicated: Secondary | ICD-10-CM | POA: Diagnosis not present

## 2015-12-14 DIAGNOSIS — J4540 Moderate persistent asthma, uncomplicated: Secondary | ICD-10-CM | POA: Diagnosis not present

## 2015-12-14 DIAGNOSIS — J4520 Mild intermittent asthma, uncomplicated: Secondary | ICD-10-CM | POA: Diagnosis not present

## 2015-12-19 DIAGNOSIS — J4520 Mild intermittent asthma, uncomplicated: Secondary | ICD-10-CM | POA: Diagnosis not present

## 2015-12-26 DIAGNOSIS — J4540 Moderate persistent asthma, uncomplicated: Secondary | ICD-10-CM | POA: Diagnosis not present

## 2016-01-17 DIAGNOSIS — J4520 Mild intermittent asthma, uncomplicated: Secondary | ICD-10-CM | POA: Diagnosis not present

## 2016-01-17 DIAGNOSIS — J4540 Moderate persistent asthma, uncomplicated: Secondary | ICD-10-CM | POA: Diagnosis not present

## 2016-01-24 DIAGNOSIS — J4540 Moderate persistent asthma, uncomplicated: Secondary | ICD-10-CM | POA: Diagnosis not present

## 2016-02-13 DIAGNOSIS — Z91013 Allergy to seafood: Secondary | ICD-10-CM | POA: Diagnosis not present

## 2016-02-13 DIAGNOSIS — K219 Gastro-esophageal reflux disease without esophagitis: Secondary | ICD-10-CM | POA: Diagnosis not present

## 2016-02-13 DIAGNOSIS — J301 Allergic rhinitis due to pollen: Secondary | ICD-10-CM | POA: Diagnosis not present

## 2016-02-13 DIAGNOSIS — J3089 Other allergic rhinitis: Secondary | ICD-10-CM | POA: Diagnosis not present

## 2016-02-13 DIAGNOSIS — J4520 Mild intermittent asthma, uncomplicated: Secondary | ICD-10-CM | POA: Diagnosis not present

## 2016-02-13 DIAGNOSIS — J4540 Moderate persistent asthma, uncomplicated: Secondary | ICD-10-CM | POA: Diagnosis not present

## 2016-02-14 DIAGNOSIS — J4520 Mild intermittent asthma, uncomplicated: Secondary | ICD-10-CM | POA: Diagnosis not present

## 2016-02-16 DIAGNOSIS — K219 Gastro-esophageal reflux disease without esophagitis: Secondary | ICD-10-CM | POA: Diagnosis not present

## 2016-02-16 DIAGNOSIS — J301 Allergic rhinitis due to pollen: Secondary | ICD-10-CM | POA: Diagnosis not present

## 2016-02-16 DIAGNOSIS — J4540 Moderate persistent asthma, uncomplicated: Secondary | ICD-10-CM | POA: Diagnosis not present

## 2016-02-16 DIAGNOSIS — J3089 Other allergic rhinitis: Secondary | ICD-10-CM | POA: Diagnosis not present

## 2016-02-16 DIAGNOSIS — Z91013 Allergy to seafood: Secondary | ICD-10-CM | POA: Diagnosis not present

## 2016-02-21 DIAGNOSIS — J4540 Moderate persistent asthma, uncomplicated: Secondary | ICD-10-CM | POA: Diagnosis not present

## 2016-03-12 DIAGNOSIS — J4520 Mild intermittent asthma, uncomplicated: Secondary | ICD-10-CM | POA: Diagnosis not present

## 2016-03-12 DIAGNOSIS — J4540 Moderate persistent asthma, uncomplicated: Secondary | ICD-10-CM | POA: Diagnosis not present

## 2016-03-20 DIAGNOSIS — J4540 Moderate persistent asthma, uncomplicated: Secondary | ICD-10-CM | POA: Diagnosis not present

## 2016-04-10 DIAGNOSIS — J4540 Moderate persistent asthma, uncomplicated: Secondary | ICD-10-CM | POA: Diagnosis not present

## 2016-04-10 DIAGNOSIS — J4520 Mild intermittent asthma, uncomplicated: Secondary | ICD-10-CM | POA: Diagnosis not present

## 2016-04-12 DIAGNOSIS — J4540 Moderate persistent asthma, uncomplicated: Secondary | ICD-10-CM | POA: Diagnosis not present

## 2016-05-09 DIAGNOSIS — J4540 Moderate persistent asthma, uncomplicated: Secondary | ICD-10-CM | POA: Diagnosis not present

## 2016-05-14 DIAGNOSIS — J4520 Mild intermittent asthma, uncomplicated: Secondary | ICD-10-CM | POA: Diagnosis not present

## 2016-05-14 DIAGNOSIS — J4540 Moderate persistent asthma, uncomplicated: Secondary | ICD-10-CM | POA: Diagnosis not present

## 2016-06-11 DIAGNOSIS — J4540 Moderate persistent asthma, uncomplicated: Secondary | ICD-10-CM | POA: Diagnosis not present

## 2016-06-11 DIAGNOSIS — J4520 Mild intermittent asthma, uncomplicated: Secondary | ICD-10-CM | POA: Diagnosis not present

## 2016-06-12 DIAGNOSIS — J4540 Moderate persistent asthma, uncomplicated: Secondary | ICD-10-CM | POA: Diagnosis not present

## 2016-06-19 DIAGNOSIS — Z006 Encounter for examination for normal comparison and control in clinical research program: Secondary | ICD-10-CM | POA: Diagnosis not present

## 2016-06-28 DIAGNOSIS — D2271 Melanocytic nevi of right lower limb, including hip: Secondary | ICD-10-CM | POA: Diagnosis not present

## 2016-06-28 DIAGNOSIS — D239 Other benign neoplasm of skin, unspecified: Secondary | ICD-10-CM | POA: Diagnosis not present

## 2016-06-28 DIAGNOSIS — D225 Melanocytic nevi of trunk: Secondary | ICD-10-CM | POA: Diagnosis not present

## 2016-06-28 DIAGNOSIS — D2272 Melanocytic nevi of left lower limb, including hip: Secondary | ICD-10-CM | POA: Diagnosis not present

## 2016-07-09 DIAGNOSIS — J4540 Moderate persistent asthma, uncomplicated: Secondary | ICD-10-CM | POA: Diagnosis not present

## 2016-07-09 DIAGNOSIS — J4520 Mild intermittent asthma, uncomplicated: Secondary | ICD-10-CM | POA: Diagnosis not present

## 2016-07-10 DIAGNOSIS — J4540 Moderate persistent asthma, uncomplicated: Secondary | ICD-10-CM | POA: Diagnosis not present

## 2016-08-01 DIAGNOSIS — D2272 Melanocytic nevi of left lower limb, including hip: Secondary | ICD-10-CM | POA: Diagnosis not present

## 2016-08-01 DIAGNOSIS — D2371 Other benign neoplasm of skin of right lower limb, including hip: Secondary | ICD-10-CM | POA: Diagnosis not present

## 2016-08-01 DIAGNOSIS — D485 Neoplasm of uncertain behavior of skin: Secondary | ICD-10-CM | POA: Diagnosis not present

## 2016-08-07 DIAGNOSIS — J4520 Mild intermittent asthma, uncomplicated: Secondary | ICD-10-CM | POA: Diagnosis not present

## 2016-08-07 DIAGNOSIS — J4540 Moderate persistent asthma, uncomplicated: Secondary | ICD-10-CM | POA: Diagnosis not present

## 2016-08-09 DIAGNOSIS — J4540 Moderate persistent asthma, uncomplicated: Secondary | ICD-10-CM | POA: Diagnosis not present

## 2016-09-04 DIAGNOSIS — J4520 Mild intermittent asthma, uncomplicated: Secondary | ICD-10-CM | POA: Diagnosis not present

## 2016-09-04 DIAGNOSIS — J4540 Moderate persistent asthma, uncomplicated: Secondary | ICD-10-CM | POA: Diagnosis not present

## 2016-09-05 DIAGNOSIS — J4540 Moderate persistent asthma, uncomplicated: Secondary | ICD-10-CM | POA: Diagnosis not present

## 2016-09-11 DIAGNOSIS — N644 Mastodynia: Secondary | ICD-10-CM | POA: Diagnosis not present

## 2016-09-12 ENCOUNTER — Other Ambulatory Visit: Payer: Self-pay | Admitting: Obstetrics and Gynecology

## 2016-09-12 DIAGNOSIS — N644 Mastodynia: Secondary | ICD-10-CM

## 2016-09-12 DIAGNOSIS — J4540 Moderate persistent asthma, uncomplicated: Secondary | ICD-10-CM | POA: Diagnosis not present

## 2016-09-13 DIAGNOSIS — N644 Mastodynia: Secondary | ICD-10-CM | POA: Diagnosis not present

## 2016-09-21 ENCOUNTER — Other Ambulatory Visit: Payer: BLUE CROSS/BLUE SHIELD

## 2016-09-25 ENCOUNTER — Telehealth: Payer: BLUE CROSS/BLUE SHIELD | Admitting: Nurse Practitioner

## 2016-09-25 DIAGNOSIS — W57XXXA Bitten or stung by nonvenomous insect and other nonvenomous arthropods, initial encounter: Secondary | ICD-10-CM

## 2016-09-25 MED ORDER — AZITHROMYCIN 250 MG PO TABS
ORAL_TABLET | ORAL | 0 refills | Status: DC
Start: 2016-09-25 — End: 2017-03-14

## 2016-09-25 NOTE — Progress Notes (Signed)
E Visit for Rash  We are sorry that you are not feeling well. Here is how we plan to help!   Based on what you shared you have cellulitis from a bug bite. I have sent in z pak as you requested. Even though this is not the best choice. It may still work. If not better in 2 days need to see PCP or go to urgent care.          HOME CARE:   Take cool showers and avoid direct sunlight.  Apply cool compress or wet dressings.  Take a bath in an oatmeal bath.  Sprinkle content of one Aveeno packet under running faucet with comfortably warm water.  Bathe for 15-20 minutes, 1-2 times daily.  Pat dry with a towel. Do not rub the rash.  Use hydrocortisone cream.  Take an antihistamine like Benadryl for widespread rashes that itch.  The adult dose of Benadryl is 25-50 mg by mouth 4 times daily.  Caution:  This type of medication may cause sleepiness.  Do not drink alcohol, drive, or operate dangerous machinery while taking antihistamines.  Do not take these medications if you have prostate enlargement.  Read package instructions thoroughly on all medications that you take.  GET HELP RIGHT AWAY IF:   Symptoms don't go away after treatment.  Severe itching that persists.  If you rash spreads or swells.  If you rash begins to smell.  If it blisters and opens or develops a yellow-brown crust.  You develop a fever.  You have a sore throat.  You become short of breath.  MAKE SURE YOU:  Understand these instructions. Will watch your condition. Will get help right away if you are not doing well or get worse.  Thank you for choosing an e-visit. Your e-visit answers were reviewed by a board certified advanced clinical practitioner to complete your personal care plan. Depending upon the condition, your plan could have included both over the counter or prescription medications. Please review your pharmacy choice. Be sure that the pharmacy you have chosen is open so that you can pick up  your prescription now.  If there is a problem you may message your provider in MyChart to have the prescription routed to another pharmacy. Your safety is important to us. If you have drug allergies check your prescription carefully.  For the next 24 hours, you can use MyChart to ask questions about today's visit, request a non-urgent call back, or ask for a work or school excuse from your e-visit provider. You will get an email in the next two days asking about your experience. I hope that your e-visit has been valuable and will speed your recovery.

## 2016-10-02 DIAGNOSIS — J4520 Mild intermittent asthma, uncomplicated: Secondary | ICD-10-CM | POA: Diagnosis not present

## 2016-10-02 DIAGNOSIS — J4540 Moderate persistent asthma, uncomplicated: Secondary | ICD-10-CM | POA: Diagnosis not present

## 2016-10-03 DIAGNOSIS — J4540 Moderate persistent asthma, uncomplicated: Secondary | ICD-10-CM | POA: Diagnosis not present

## 2016-10-04 DIAGNOSIS — K219 Gastro-esophageal reflux disease without esophagitis: Secondary | ICD-10-CM | POA: Diagnosis not present

## 2016-10-04 DIAGNOSIS — J301 Allergic rhinitis due to pollen: Secondary | ICD-10-CM | POA: Diagnosis not present

## 2016-10-04 DIAGNOSIS — J4540 Moderate persistent asthma, uncomplicated: Secondary | ICD-10-CM | POA: Diagnosis not present

## 2016-10-04 DIAGNOSIS — J3089 Other allergic rhinitis: Secondary | ICD-10-CM | POA: Diagnosis not present

## 2016-10-29 DIAGNOSIS — J4540 Moderate persistent asthma, uncomplicated: Secondary | ICD-10-CM | POA: Diagnosis not present

## 2016-10-29 DIAGNOSIS — J4520 Mild intermittent asthma, uncomplicated: Secondary | ICD-10-CM | POA: Diagnosis not present

## 2016-10-30 DIAGNOSIS — J4540 Moderate persistent asthma, uncomplicated: Secondary | ICD-10-CM | POA: Diagnosis not present

## 2016-11-26 DIAGNOSIS — J4520 Mild intermittent asthma, uncomplicated: Secondary | ICD-10-CM | POA: Diagnosis not present

## 2016-11-26 DIAGNOSIS — J4540 Moderate persistent asthma, uncomplicated: Secondary | ICD-10-CM | POA: Diagnosis not present

## 2016-11-27 DIAGNOSIS — J4540 Moderate persistent asthma, uncomplicated: Secondary | ICD-10-CM | POA: Diagnosis not present

## 2016-12-24 DIAGNOSIS — J4520 Mild intermittent asthma, uncomplicated: Secondary | ICD-10-CM | POA: Diagnosis not present

## 2016-12-24 DIAGNOSIS — J4540 Moderate persistent asthma, uncomplicated: Secondary | ICD-10-CM | POA: Diagnosis not present

## 2016-12-25 DIAGNOSIS — J4540 Moderate persistent asthma, uncomplicated: Secondary | ICD-10-CM | POA: Diagnosis not present

## 2017-01-04 ENCOUNTER — Encounter: Payer: Self-pay | Admitting: Internal Medicine

## 2017-01-21 DIAGNOSIS — J4520 Mild intermittent asthma, uncomplicated: Secondary | ICD-10-CM | POA: Diagnosis not present

## 2017-01-21 DIAGNOSIS — J4540 Moderate persistent asthma, uncomplicated: Secondary | ICD-10-CM | POA: Diagnosis not present

## 2017-01-22 DIAGNOSIS — J4540 Moderate persistent asthma, uncomplicated: Secondary | ICD-10-CM | POA: Diagnosis not present

## 2017-02-04 DIAGNOSIS — J301 Allergic rhinitis due to pollen: Secondary | ICD-10-CM | POA: Diagnosis not present

## 2017-02-04 DIAGNOSIS — J4540 Moderate persistent asthma, uncomplicated: Secondary | ICD-10-CM | POA: Diagnosis not present

## 2017-02-04 DIAGNOSIS — K219 Gastro-esophageal reflux disease without esophagitis: Secondary | ICD-10-CM | POA: Diagnosis not present

## 2017-02-04 DIAGNOSIS — J3089 Other allergic rhinitis: Secondary | ICD-10-CM | POA: Diagnosis not present

## 2017-02-14 DIAGNOSIS — J4520 Mild intermittent asthma, uncomplicated: Secondary | ICD-10-CM | POA: Diagnosis not present

## 2017-02-14 DIAGNOSIS — Z681 Body mass index (BMI) 19 or less, adult: Secondary | ICD-10-CM | POA: Diagnosis not present

## 2017-02-14 DIAGNOSIS — J4540 Moderate persistent asthma, uncomplicated: Secondary | ICD-10-CM | POA: Diagnosis not present

## 2017-02-14 DIAGNOSIS — Z01419 Encounter for gynecological examination (general) (routine) without abnormal findings: Secondary | ICD-10-CM | POA: Diagnosis not present

## 2017-02-18 DIAGNOSIS — J4520 Mild intermittent asthma, uncomplicated: Secondary | ICD-10-CM | POA: Diagnosis not present

## 2017-02-18 DIAGNOSIS — J4540 Moderate persistent asthma, uncomplicated: Secondary | ICD-10-CM | POA: Diagnosis not present

## 2017-02-28 DIAGNOSIS — Z681 Body mass index (BMI) 19 or less, adult: Secondary | ICD-10-CM | POA: Diagnosis not present

## 2017-02-28 DIAGNOSIS — Z01419 Encounter for gynecological examination (general) (routine) without abnormal findings: Secondary | ICD-10-CM | POA: Diagnosis not present

## 2017-03-14 ENCOUNTER — Ambulatory Visit: Payer: BLUE CROSS/BLUE SHIELD | Admitting: Family Medicine

## 2017-03-14 ENCOUNTER — Encounter: Payer: Self-pay | Admitting: Family Medicine

## 2017-03-14 VITALS — BP 118/80 | HR 79 | Temp 98.9°F | Ht 64.5 in | Wt 116.9 lb

## 2017-03-14 DIAGNOSIS — J45901 Unspecified asthma with (acute) exacerbation: Secondary | ICD-10-CM | POA: Diagnosis not present

## 2017-03-14 DIAGNOSIS — J069 Acute upper respiratory infection, unspecified: Secondary | ICD-10-CM | POA: Diagnosis not present

## 2017-03-14 DIAGNOSIS — H6981 Other specified disorders of Eustachian tube, right ear: Secondary | ICD-10-CM | POA: Diagnosis not present

## 2017-03-14 NOTE — Progress Notes (Signed)
HPI:  Acute visit for respiratory illness: -started:yesterday -symptoms:nasal congestion, drainage in throat, some asthma symptoms yesterday - she contacted her allergist - better today, R ear pressure/tinnitus (not pulsatile), cough - not bad  -denies:fever, SOB today, NVD, tooth pain, body aches -has tried: her rescue asthma medications yesterday - responded well, she also had been exposed to perfume which is a trigger for her -sick contacts/travel/risks: no reported flu, strep or tick exposure -Hx of: asthma, allergies ROS: See pertinent positives and negatives per HPI.  Past Medical History:  Diagnosis Date  . Allergic rhinitis    "to everything"  . Asthma     hosp x 2 last 2004; bronchial thermoplasty [facility] 2013 very helpful     Past Surgical History:  Procedure Laterality Date  . BRONCHOTHERMOPLASTY  2013   [facility] [employer]    Family History  Problem Relation Age of Onset  . Asthma Father   . Thyroid disease Father   . Thyroid disease Mother   . Heart disease Maternal Grandfather   . Stroke Maternal Grandfather   . Hypertension Maternal Grandfather   . Cancer Paternal Grandfather        Pancreatic  . Other Unknown        raynauds mom and gm    Social History   Socioeconomic History  . Marital status: Single    Spouse name: None  . Number of children: None  . Years of education: None  . Highest education level: None  Social Needs  . Financial resource strain: None  . Food insecurity - worry: None  . Food insecurity - inability: None  . Transportation needs - medical: None  . Transportation needs - non-medical: None  Occupational History  . None  Tobacco Use  . Smoking status: Never Smoker  . Smokeless tobacco: Never Used  Substance and Sexual Activity  . Alcohol use: Yes    Comment: socially  . Drug use: No  . Sexual activity: No  Other Topics Concern  . None  Social History Narrative   6-8 hours of sleep per night   4 people living in the  home;parent and great aunt who is moving out.    [place] from [place] from [place] over moved to area with family   Jeh[employer]ah's Witness   Dental hygienist with Dr Alison Butler   Neg ets pets    Social etoh neg fa     Current Outpatient Medications:  .  albuterol (PROVENTIL) (2.5 MG/3ML) 0.083% nebulizer solution, Take 3 mLs (2.5 mg total) by nebulization every 4 (four) hours as needed for wheezing or shortness of breath., Disp: 75 mL, Rfl: 12 .  azelastine (ASTELIN) 137 MCG/SPRAY nasal spray, , Disp: , Rfl:  .  BLISOVI 24 FE 1-20 MG-MCG(24) tablet, Take 1 tablet by mouth daily., Disp: , Rfl: 0 .  BREO ELLIPTA 200-25 MCG/INH AEPB, , Disp: , Rfl: 2 .  EPIPEN 2-PAK 0.3 MG/0.3ML SOAJ injection, , Disp: , Rfl:  .  Ganciclovir (ZIRGAN) 0.15 % GEL, Apply to eye., Disp: , Rfl:  .  ipratropium (ATROVENT) 0.02 % nebulizer solution, Take 2.5 mLs (0.5 mg total) by nebulization 4 (four) times daily., Disp: 75 mL, Rfl: 12 .  levocetirizine (XYZAL) 5 MG tablet, Take 1 tablet by mouth daily., Disp: , Rfl:  .  montelukast (SINGULAIR) 10 MG tablet, Take 1 tablet by mouth daily., Disp: , Rfl:  .  omalizumab (XOLAIR) 150 MG injection, Inject 150 mg into the skin every 14 (fourteen) days., Disp: ,  Rfl:  .  omeprazole (PRILOSEC) 20 MG capsule, Take 20 mg by mouth daily. , Disp: , Rfl:  .  XOPENEX HFA 45 MCG/ACT inhaler, INHALE 2 PUFFS EVERY 4 TO 6 HOURS AS NEEDED, Disp: , Rfl: 0  EXAM:  Vitals:   03/14/17 1420  BP: 118/80  Pulse: 79  Temp: 98.9 F (37.2 C)  SpO2: 97%    Body mass index is 19.76 kg/m.  GENERAL: vitals reviewed and listed above, alert, oriented, appears well hydrated and in no acute distress  HEENT: atraumatic, conjunttiva clear, no obvious abnormalities on inspection of external nose and ears, normal appearance of ear canals and TMs except for clear effusions bilaterally with some retraction of the right TM without bulging or erythema or discolored effusion, clear nasal congestion, mild post  oropharyngeal erythema with PND, no tonsillar edema or exudate, no sinus TTP  NECK: no obvious masses on inspection  LUNGS: clear to auscultation bilaterally, no wheezes, rales or rhonchi, good air movement,   CV: HRRR, no peripheral edema  MS: moves all extremities without noticeable abnormality  PSYCH: pleasant and cooperative, no obvious depression or anxiety  ASSESSMENT AND PLAN:  Discussed the following assessment and plan:  Viral upper respiratory illness  Dysfunction of right eustachian tube  Asthma with acute exacerbation, unspecified asthma severity, unspecified whether persistent  -given HPI and exam findings today, a serious infection or illness is unlikely. We discussed potential etiologies, with VURI being most likely, and advised supportive care and monitoring. We discussed treatment side effects, likely course, antibiotic misuse, transmission, and signs of developing a serious illness. -She reports she cannot take steroids because of herpes eye infection -Plans to follow-up with her specialist if symptoms worsen or her asthma does not respond to -reports she works closely with her asthma doctor and has flares in her symptoms like she had yesterday frequently, she reports she did call her asthma doctor and today her asthma symptoms are pretty much resolved -Advised to follow-up or see ENT if the ear issues worsening instead of improving with nasal saline/massage and symptomatic care -of course, we advised to return or notify a doctor immediately if symptoms worsen or persist or new concerns arise.  Declined AVS.  There are no Patient Instructions on file for this visit.  Alison Butler, Alison Bastone R., DO

## 2017-03-15 DIAGNOSIS — J3089 Other allergic rhinitis: Secondary | ICD-10-CM | POA: Diagnosis not present

## 2017-03-15 DIAGNOSIS — J4540 Moderate persistent asthma, uncomplicated: Secondary | ICD-10-CM | POA: Diagnosis not present

## 2017-03-15 DIAGNOSIS — J301 Allergic rhinitis due to pollen: Secondary | ICD-10-CM | POA: Diagnosis not present

## 2017-03-15 DIAGNOSIS — J4541 Moderate persistent asthma with (acute) exacerbation: Secondary | ICD-10-CM | POA: Diagnosis not present

## 2017-03-18 DIAGNOSIS — J4520 Mild intermittent asthma, uncomplicated: Secondary | ICD-10-CM | POA: Diagnosis not present

## 2017-03-18 DIAGNOSIS — J4540 Moderate persistent asthma, uncomplicated: Secondary | ICD-10-CM | POA: Diagnosis not present

## 2017-03-19 DIAGNOSIS — J4540 Moderate persistent asthma, uncomplicated: Secondary | ICD-10-CM | POA: Diagnosis not present

## 2017-04-17 DIAGNOSIS — J4540 Moderate persistent asthma, uncomplicated: Secondary | ICD-10-CM | POA: Diagnosis not present

## 2017-04-17 DIAGNOSIS — J4520 Mild intermittent asthma, uncomplicated: Secondary | ICD-10-CM | POA: Diagnosis not present

## 2017-04-18 DIAGNOSIS — J4540 Moderate persistent asthma, uncomplicated: Secondary | ICD-10-CM | POA: Diagnosis not present

## 2017-05-13 DIAGNOSIS — J4520 Mild intermittent asthma, uncomplicated: Secondary | ICD-10-CM | POA: Diagnosis not present

## 2017-05-13 DIAGNOSIS — J4540 Moderate persistent asthma, uncomplicated: Secondary | ICD-10-CM | POA: Diagnosis not present

## 2017-05-14 DIAGNOSIS — J4540 Moderate persistent asthma, uncomplicated: Secondary | ICD-10-CM | POA: Diagnosis not present

## 2017-05-15 DIAGNOSIS — J4540 Moderate persistent asthma, uncomplicated: Secondary | ICD-10-CM | POA: Diagnosis not present

## 2017-05-15 DIAGNOSIS — J4520 Mild intermittent asthma, uncomplicated: Secondary | ICD-10-CM | POA: Diagnosis not present

## 2017-06-10 DIAGNOSIS — J4520 Mild intermittent asthma, uncomplicated: Secondary | ICD-10-CM | POA: Diagnosis not present

## 2017-06-10 DIAGNOSIS — J4540 Moderate persistent asthma, uncomplicated: Secondary | ICD-10-CM | POA: Diagnosis not present

## 2017-06-11 DIAGNOSIS — J4540 Moderate persistent asthma, uncomplicated: Secondary | ICD-10-CM | POA: Diagnosis not present

## 2017-07-05 DIAGNOSIS — D225 Melanocytic nevi of trunk: Secondary | ICD-10-CM | POA: Diagnosis not present

## 2017-07-05 DIAGNOSIS — D485 Neoplasm of uncertain behavior of skin: Secondary | ICD-10-CM | POA: Diagnosis not present

## 2017-07-05 DIAGNOSIS — D235 Other benign neoplasm of skin of trunk: Secondary | ICD-10-CM | POA: Diagnosis not present

## 2017-07-08 DIAGNOSIS — J4540 Moderate persistent asthma, uncomplicated: Secondary | ICD-10-CM | POA: Diagnosis not present

## 2017-07-08 DIAGNOSIS — J4520 Mild intermittent asthma, uncomplicated: Secondary | ICD-10-CM | POA: Diagnosis not present

## 2017-07-09 DIAGNOSIS — J4540 Moderate persistent asthma, uncomplicated: Secondary | ICD-10-CM | POA: Diagnosis not present

## 2017-08-05 DIAGNOSIS — J4520 Mild intermittent asthma, uncomplicated: Secondary | ICD-10-CM | POA: Diagnosis not present

## 2017-08-05 DIAGNOSIS — J4540 Moderate persistent asthma, uncomplicated: Secondary | ICD-10-CM | POA: Diagnosis not present

## 2017-08-06 DIAGNOSIS — J4540 Moderate persistent asthma, uncomplicated: Secondary | ICD-10-CM | POA: Diagnosis not present

## 2017-09-02 DIAGNOSIS — J4520 Mild intermittent asthma, uncomplicated: Secondary | ICD-10-CM | POA: Diagnosis not present

## 2017-09-02 DIAGNOSIS — J4540 Moderate persistent asthma, uncomplicated: Secondary | ICD-10-CM | POA: Diagnosis not present

## 2017-09-03 DIAGNOSIS — J4540 Moderate persistent asthma, uncomplicated: Secondary | ICD-10-CM | POA: Diagnosis not present

## 2017-09-12 DIAGNOSIS — M9905 Segmental and somatic dysfunction of pelvic region: Secondary | ICD-10-CM | POA: Diagnosis not present

## 2017-09-12 DIAGNOSIS — M9904 Segmental and somatic dysfunction of sacral region: Secondary | ICD-10-CM | POA: Diagnosis not present

## 2017-09-12 DIAGNOSIS — M9901 Segmental and somatic dysfunction of cervical region: Secondary | ICD-10-CM | POA: Diagnosis not present

## 2017-09-12 DIAGNOSIS — M9903 Segmental and somatic dysfunction of lumbar region: Secondary | ICD-10-CM | POA: Diagnosis not present

## 2017-09-16 DIAGNOSIS — M9905 Segmental and somatic dysfunction of pelvic region: Secondary | ICD-10-CM | POA: Diagnosis not present

## 2017-09-16 DIAGNOSIS — M9903 Segmental and somatic dysfunction of lumbar region: Secondary | ICD-10-CM | POA: Diagnosis not present

## 2017-09-16 DIAGNOSIS — M9904 Segmental and somatic dysfunction of sacral region: Secondary | ICD-10-CM | POA: Diagnosis not present

## 2017-09-16 DIAGNOSIS — M9901 Segmental and somatic dysfunction of cervical region: Secondary | ICD-10-CM | POA: Diagnosis not present

## 2017-09-18 DIAGNOSIS — M9903 Segmental and somatic dysfunction of lumbar region: Secondary | ICD-10-CM | POA: Diagnosis not present

## 2017-09-18 DIAGNOSIS — M9905 Segmental and somatic dysfunction of pelvic region: Secondary | ICD-10-CM | POA: Diagnosis not present

## 2017-09-18 DIAGNOSIS — M9904 Segmental and somatic dysfunction of sacral region: Secondary | ICD-10-CM | POA: Diagnosis not present

## 2017-09-18 DIAGNOSIS — M9901 Segmental and somatic dysfunction of cervical region: Secondary | ICD-10-CM | POA: Diagnosis not present

## 2017-09-30 ENCOUNTER — Telehealth: Payer: Self-pay

## 2017-09-30 ENCOUNTER — Encounter: Payer: Self-pay | Admitting: Internal Medicine

## 2017-09-30 DIAGNOSIS — J4540 Moderate persistent asthma, uncomplicated: Secondary | ICD-10-CM | POA: Diagnosis not present

## 2017-09-30 DIAGNOSIS — J4520 Mild intermittent asthma, uncomplicated: Secondary | ICD-10-CM | POA: Diagnosis not present

## 2017-09-30 NOTE — Telephone Encounter (Signed)
Patient previously received approval to transfer to Dr. Selena BattenKim in 2016.

## 2017-09-30 NOTE — Telephone Encounter (Signed)
Copied from CRM [id number]. Topic: Quick Communication - Office Called Patient >> Sep 30, 2017 10:59 AM Consuela MimesAustin, Timothy D wrote: Reason for CRM: Called Patient to ask her to reschedule her appointment this Thursday from Dr. Selena BattenKim to her PCP (Dr. Fabian SharpPanosh) >> Sep 30, 2017  1:05 PM Windy KalataMichael, Taylor L, NT wrote: Patient is requesting a transfer to Dr. Selena BattenKim from Dr. Fabian SharpPanosh so therefore she wants me to leave her appt until she hears back if she will accept her.  Dr. Selena BattenKim - Pt has seen you in the past and would like to know if you would accept her as a New Patient. Please advise. Thanks!

## 2017-10-01 DIAGNOSIS — J4540 Moderate persistent asthma, uncomplicated: Secondary | ICD-10-CM | POA: Diagnosis not present

## 2017-10-03 ENCOUNTER — Encounter: Payer: Self-pay | Admitting: Family Medicine

## 2017-10-03 ENCOUNTER — Ambulatory Visit: Payer: BLUE CROSS/BLUE SHIELD | Admitting: Family Medicine

## 2017-10-03 ENCOUNTER — Other Ambulatory Visit: Payer: Self-pay | Admitting: Family Medicine

## 2017-10-03 VITALS — BP 120/60 | HR 78 | Temp 98.1°F | Wt 108.1 lb

## 2017-10-03 DIAGNOSIS — R251 Tremor, unspecified: Secondary | ICD-10-CM | POA: Diagnosis not present

## 2017-10-03 DIAGNOSIS — R110 Nausea: Secondary | ICD-10-CM

## 2017-10-03 DIAGNOSIS — M9901 Segmental and somatic dysfunction of cervical region: Secondary | ICD-10-CM | POA: Diagnosis not present

## 2017-10-03 DIAGNOSIS — M9904 Segmental and somatic dysfunction of sacral region: Secondary | ICD-10-CM | POA: Diagnosis not present

## 2017-10-03 DIAGNOSIS — M9903 Segmental and somatic dysfunction of lumbar region: Secondary | ICD-10-CM | POA: Diagnosis not present

## 2017-10-03 DIAGNOSIS — R079 Chest pain, unspecified: Secondary | ICD-10-CM | POA: Diagnosis not present

## 2017-10-03 DIAGNOSIS — M9905 Segmental and somatic dysfunction of pelvic region: Secondary | ICD-10-CM | POA: Diagnosis not present

## 2017-10-03 NOTE — Progress Notes (Signed)
HPI:  Alison Butler is here to establish care. Transfer from Alison Butler. PMH chronic and has history of allergies, asthma.  Has the following chronic problems that require follow up and concerns today:  ? Food allergies/shaking: -for about 1 year -reports 2-3 episodes a month of shaking spells accompanied by nausea -sometimes about 1-2 hours after eating, sometimes when she wakes up -she thought was food allergy since seemed to be related to fish, but then happened after eating at a restaurant that was very careful and had dedicated fryers - she is working with her allergist but can't do testing due to her medications -has FH thyroid dz and also is worreed about her blood sugar and also wonders if it is related to her asthma/allergy meds -she has severe asthma and allergies and sees Alison Butler for this -she had bronchial thermoplasty for her asthma 6 years ago  -sometimes the last month has R sided "lung" pain - no increased SOB, cough or wheezing, plans to follow up with her asthma doctor -denies immune deficiency -in 2016 admitted to being high strung, today reports not an anxious person and no hx of anxiety -extensive lab evaluation with PCP in 2015 with celiac panel, thyroid panel, CMP, CBC,  rheumatological labs all normal  ROS negative for unless reported above: fevers, unintentional weight loss, hearing or vision loss, chest pain, palpitations, struggling to breath, hemoptysis, melena, hematochezia, hematuria, falls, loc, si, thoughts of self harm  Past Medical History:  Diagnosis Date  . Allergic rhinitis    "to everything"  . Asthma     hosp x 2 last 2004; bronchial therm[facility] very helpful     Past Surgical History:  Procedure Laterality Date  . BRONCHOTHERMOPLASTY  2013   [facility] [employer]    Family History  Problem Relation Age of Onset  . Asthma Father   . Thyroid disease Father   . Thyroid disease Mother   . Heart disease Maternal Grandfather   .  Stroke Maternal Grandfather   . Hypertension Maternal Grandfather   . Cancer Paternal Grandfather        Pancreatic  . Other Unknown        raynauds mom and gm    Social History   Socioeconomic History  . Marital status: Single    Spouse name: Not on file  . Number of children: Not on file  . Years of education: Not on file  . Highest education level: Not on file  Occupational History  . Not on file  Social Needs  . Financial resource strain: Not on file  . Food insecurity:    Worry: Not on file    Inability: Not on file  . Transportation needs:    Medical: Not on file    Non-medical: Not on file  Tobacco Use  . Smoking status: Never Smoker  . Smokeless tobacco: Never Used  Substance and Sexual Activity  . Alcohol use: Yes    Comment: socially  . Drug use: No  . Sexual activity: Never  Lifestyle  . Physical activity:    Days per week: Not on file    Minutes per session: Not on file  . Stress: Not on file  Relationships  . Social connections:    Talks on phone: Not on file    Gets together: Not on file    Attends religious service: Not on file    Active member of club or organization: Not on file    Attends meetings  of clubs or organizations: Not on file    Relationship status: Not on file  Other Topics Concern  . Not on file  Social History Narrative   6-8 hours of sleep per night   4 people living in the home;parent and great aunt who is moving out.    [place]ig from [place] from [place] over moved to area with family   Jehovah's Witness   Dental hygienist with Alison Lorin PicketScott Butler   Neg ets pets    Social etoh neg fa     Current Outpatient Medications:  .  albuterol (PROVENTIL) (2.5 MG/3ML) 0.083% nebulizer solution, Take 3 mLs (2.5 mg total) by nebulization every 4 (four) hours as needed for wheezing or shortness of breath., Disp: 75 mL, Rfl: 12 .  azelastine (ASTELIN) 137 MCG/SPRAY nasal spray, , Disp: , Rfl:  .  BLISOVI 24 FE 1-20 MG-MCG(24) tablet, Take 1 tablet  by mouth daily., Disp: , Rfl: 0 .  BREO ELLIPTA 200-25 MCG/INH AEPB, , Disp: , Rfl: 2 .  EPIPEN 2-PAK 0.3 MG/0.3ML SOAJ injection, , Disp: , Rfl:  .  Ganciclovir (ZIRGAN) 0.15 % GEL, Apply to eye., Disp: , Rfl:  .  ipratropium (ATROVENT) 0.02 % nebulizer solution, Take 2.5 mLs (0.5 mg total) by nebulization 4 (four) times daily., Disp: 75 mL, Rfl: 12 .  levocetirizine (XYZAL) 5 MG tablet, Take 1 tablet by mouth daily., Disp: , Rfl:  .  montelukast (SINGULAIR) 10 MG tablet, Take 1 tablet by mouth daily., Disp: , Rfl:  .  omalizumab (XOLAIR) 150 MG injection, Inject 150 mg into the skin every 14 (fourteen) days., Disp: , Rfl:  .  omeprazole (PRILOSEC) 20 MG capsule, Take 20 mg by mouth daily. , Disp: , Rfl:  .  XOPENEX HFA 45 MCG/ACT inhaler, INHALE 2 PUFFS EVERY 4 TO 6 HOURS AS NEEDED, Disp: , Rfl: 0  EXAM:  Vitals:   10/03/17 1630  BP: 120/60  Pulse: 78  Temp: 98.1 F (36.7 C)  SpO2: 98%    Body mass index is 18.27 kg/m.  GENERAL: vitals reviewed and listed above, alert, oriented, appears well hydrated and in no acute distress  HEENT: atraumatic, conjunttiva clear, no obvious abnormalities on inspection of external nose and ears  NECK: no obvious masses on inspection  LUNGS: clear to auscultation bilaterally, no wheezes, rales or rhonchi, good air movement  CV: HRRR, no peripheral edema  MS: moves all extremities without noticeable abnormality  PSYCH: pleasant and cooperative, no obvious depression or anxiety  ASSESSMENT AND PLAN:  Discussed the following assessment and plan:  Shaking - Plan: Basic metabolic panel, CBC, Hemoglobin A1c, TSH  Nausea  Resting chest pain - Plan: DG Chest 2 View -We reviewed the PMH, PSH, FH, SH, Meds and Allergies. -we discussed possible serious and likely etiologies, workup and treatment, treatment risks and return precautions; discussed that testing for hypoglycemia is complicated and offered endo referral -after this discussion,  [name] opted for fasting labs tomorrow morning per orders, CXR, follow up with her allergist about this in terms of possible allergies, possible endo evaluation -discussed possibility of anxiety/panic attacks, she does not feel she is an anxious person - may need to look into this more if eval unrevealing, glu monitor to check sugar during events -follow up advised 1 month -of course, we advised Mataya  to return or notify a doctor immediately if symptoms worsen or persist or new concerns arise.   Patient Instructions  BEFORE YOU LEAVE: -xray sheet -lab appt  tomorrow morning for fasting labs -follow up: 1 month  Get the chest xray.  Follow up with your allergist about these symptoms.  Check your blood sugar during these events and keep a log.  Follow up sooner if any worsening or new concerns.  Eat regular and healthy low sugar meals.      Terressa Koyanagi

## 2017-10-03 NOTE — Patient Instructions (Signed)
BEFORE YOU LEAVE: -xray sheet -lab appt tomorrow morning for fasting labs -follow up: 1 month  Get the chest xray.  Follow up with your allergist about these symptoms.  Check your blood sugar during these events and keep a log.  Follow up sooner if any worsening or new concerns.  Eat regular and healthy low sugar meals.

## 2017-10-04 ENCOUNTER — Ambulatory Visit (INDEPENDENT_AMBULATORY_CARE_PROVIDER_SITE_OTHER)
Admission: RE | Admit: 2017-10-04 | Discharge: 2017-10-04 | Disposition: A | Discharge status: Home / Self Care | Payer: BLUE CROSS/BLUE SHIELD | Source: Ambulatory Visit | Attending: Family Medicine | Admitting: Family Medicine

## 2017-10-04 ENCOUNTER — Other Ambulatory Visit: Payer: BLUE CROSS/BLUE SHIELD

## 2017-10-04 DIAGNOSIS — R079 Chest pain, unspecified: Secondary | ICD-10-CM | POA: Diagnosis not present

## 2017-10-04 LAB — CBC
HEMATOCRIT: 40.9 % (ref 36.0–46.0)
Hemoglobin: 14 g/dL (ref 12.0–15.0)
MCHC: 34.2 g/dL (ref 30.0–36.0)
MCV: 97.6 fl (ref 78.0–100.0)
PLATELETS: 216 10*3/uL (ref 150.0–400.0)
RBC: 4.19 Mil/uL (ref 3.87–5.11)
RDW: 12.2 % (ref 11.5–15.5)
WBC: 5.3 10*3/uL (ref 4.0–10.5)

## 2017-10-04 LAB — BASIC METABOLIC PANEL
BUN: 13 mg/dL (ref 6–23)
CALCIUM: 9.3 mg/dL (ref 8.4–10.5)
CHLORIDE: 105 meq/L (ref 96–112)
CO2: 27 mEq/L (ref 19–32)
CREATININE: 0.83 mg/dL (ref 0.40–1.20)
GFR: 86.29 mL/min (ref >60.00)
Glucose, Bld: 91 mg/dL (ref 70–99)
Potassium: 4.1 mEq/L (ref 3.5–5.1)
Sodium: 140 mEq/L (ref 135–145)

## 2017-10-04 LAB — HEMOGLOBIN A1C: HEMOGLOBIN A1C: 5.2 % (ref 4.6–6.5)

## 2017-10-04 LAB — TSH: TSH: 1.31 u[IU]/mL (ref 0.35–4.50)

## 2017-10-04 NOTE — Addendum Note (Signed)
Addended by: Bonnye FavaKWEI, NANA K on: 10/04/2017 08:36 AM   Modules accepted: Orders

## 2017-10-07 ENCOUNTER — Encounter: Payer: Self-pay | Admitting: Family Medicine

## 2017-10-25 DIAGNOSIS — M9903 Segmental and somatic dysfunction of lumbar region: Secondary | ICD-10-CM | POA: Diagnosis not present

## 2017-10-25 DIAGNOSIS — M9905 Segmental and somatic dysfunction of pelvic region: Secondary | ICD-10-CM | POA: Diagnosis not present

## 2017-10-25 DIAGNOSIS — M9904 Segmental and somatic dysfunction of sacral region: Secondary | ICD-10-CM | POA: Diagnosis not present

## 2017-10-25 DIAGNOSIS — M9901 Segmental and somatic dysfunction of cervical region: Secondary | ICD-10-CM | POA: Diagnosis not present

## 2017-10-28 DIAGNOSIS — J4540 Moderate persistent asthma, uncomplicated: Secondary | ICD-10-CM | POA: Diagnosis not present

## 2017-10-28 DIAGNOSIS — J4520 Mild intermittent asthma, uncomplicated: Secondary | ICD-10-CM | POA: Diagnosis not present

## 2017-10-29 DIAGNOSIS — J4540 Moderate persistent asthma, uncomplicated: Secondary | ICD-10-CM | POA: Diagnosis not present

## 2017-11-25 DIAGNOSIS — J4540 Moderate persistent asthma, uncomplicated: Secondary | ICD-10-CM | POA: Diagnosis not present

## 2017-11-25 DIAGNOSIS — J4520 Mild intermittent asthma, uncomplicated: Secondary | ICD-10-CM | POA: Diagnosis not present

## 2017-11-26 DIAGNOSIS — J4540 Moderate persistent asthma, uncomplicated: Secondary | ICD-10-CM | POA: Diagnosis not present

## 2017-11-28 DIAGNOSIS — K219 Gastro-esophageal reflux disease without esophagitis: Secondary | ICD-10-CM | POA: Diagnosis not present

## 2017-11-28 DIAGNOSIS — J309 Allergic rhinitis, unspecified: Secondary | ICD-10-CM | POA: Diagnosis not present

## 2017-11-28 DIAGNOSIS — B0229 Other postherpetic nervous system involvement: Secondary | ICD-10-CM | POA: Diagnosis not present

## 2017-11-28 DIAGNOSIS — J45909 Unspecified asthma, uncomplicated: Secondary | ICD-10-CM | POA: Diagnosis not present

## 2017-12-19 DIAGNOSIS — J4520 Mild intermittent asthma, uncomplicated: Secondary | ICD-10-CM | POA: Diagnosis not present

## 2017-12-19 DIAGNOSIS — J4540 Moderate persistent asthma, uncomplicated: Secondary | ICD-10-CM | POA: Diagnosis not present

## 2017-12-23 DIAGNOSIS — J4540 Moderate persistent asthma, uncomplicated: Secondary | ICD-10-CM | POA: Diagnosis not present

## 2018-01-15 DIAGNOSIS — J4540 Moderate persistent asthma, uncomplicated: Secondary | ICD-10-CM | POA: Diagnosis not present

## 2018-01-15 DIAGNOSIS — J4520 Mild intermittent asthma, uncomplicated: Secondary | ICD-10-CM | POA: Diagnosis not present

## 2018-01-16 DIAGNOSIS — J4540 Moderate persistent asthma, uncomplicated: Secondary | ICD-10-CM | POA: Diagnosis not present

## 2018-01-16 DIAGNOSIS — J4520 Mild intermittent asthma, uncomplicated: Secondary | ICD-10-CM | POA: Diagnosis not present

## 2018-01-20 DIAGNOSIS — J4540 Moderate persistent asthma, uncomplicated: Secondary | ICD-10-CM | POA: Diagnosis not present

## 2018-02-11 DIAGNOSIS — J3089 Other allergic rhinitis: Secondary | ICD-10-CM | POA: Diagnosis not present

## 2018-02-11 DIAGNOSIS — J301 Allergic rhinitis due to pollen: Secondary | ICD-10-CM | POA: Diagnosis not present

## 2018-02-11 DIAGNOSIS — J4540 Moderate persistent asthma, uncomplicated: Secondary | ICD-10-CM | POA: Diagnosis not present

## 2018-02-11 DIAGNOSIS — K219 Gastro-esophageal reflux disease without esophagitis: Secondary | ICD-10-CM | POA: Diagnosis not present

## 2018-02-12 DIAGNOSIS — J4520 Mild intermittent asthma, uncomplicated: Secondary | ICD-10-CM | POA: Diagnosis not present

## 2018-02-12 DIAGNOSIS — J4540 Moderate persistent asthma, uncomplicated: Secondary | ICD-10-CM | POA: Diagnosis not present

## 2018-02-17 DIAGNOSIS — J4540 Moderate persistent asthma, uncomplicated: Secondary | ICD-10-CM | POA: Diagnosis not present

## 2018-03-11 DIAGNOSIS — J4520 Mild intermittent asthma, uncomplicated: Secondary | ICD-10-CM | POA: Diagnosis not present

## 2018-03-11 DIAGNOSIS — J4540 Moderate persistent asthma, uncomplicated: Secondary | ICD-10-CM | POA: Diagnosis not present

## 2018-03-12 DIAGNOSIS — J4540 Moderate persistent asthma, uncomplicated: Secondary | ICD-10-CM | POA: Diagnosis not present

## 2018-03-12 DIAGNOSIS — J4520 Mild intermittent asthma, uncomplicated: Secondary | ICD-10-CM | POA: Diagnosis not present

## 2018-03-17 DIAGNOSIS — J4540 Moderate persistent asthma, uncomplicated: Secondary | ICD-10-CM | POA: Diagnosis not present

## 2018-03-20 DIAGNOSIS — K219 Gastro-esophageal reflux disease without esophagitis: Secondary | ICD-10-CM | POA: Diagnosis not present

## 2018-03-20 DIAGNOSIS — J309 Allergic rhinitis, unspecified: Secondary | ICD-10-CM | POA: Diagnosis not present

## 2018-03-20 DIAGNOSIS — J45909 Unspecified asthma, uncomplicated: Secondary | ICD-10-CM | POA: Diagnosis not present

## 2018-03-27 DIAGNOSIS — J301 Allergic rhinitis due to pollen: Secondary | ICD-10-CM | POA: Diagnosis not present

## 2018-03-27 DIAGNOSIS — J4540 Moderate persistent asthma, uncomplicated: Secondary | ICD-10-CM | POA: Diagnosis not present

## 2018-03-27 DIAGNOSIS — J3089 Other allergic rhinitis: Secondary | ICD-10-CM | POA: Diagnosis not present

## 2018-03-27 DIAGNOSIS — K219 Gastro-esophageal reflux disease without esophagitis: Secondary | ICD-10-CM | POA: Diagnosis not present

## 2018-04-07 DIAGNOSIS — J4520 Mild intermittent asthma, uncomplicated: Secondary | ICD-10-CM | POA: Diagnosis not present

## 2018-04-07 DIAGNOSIS — J4540 Moderate persistent asthma, uncomplicated: Secondary | ICD-10-CM | POA: Diagnosis not present

## 2018-04-14 DIAGNOSIS — J4540 Moderate persistent asthma, uncomplicated: Secondary | ICD-10-CM | POA: Diagnosis not present

## 2018-04-16 DIAGNOSIS — J4520 Mild intermittent asthma, uncomplicated: Secondary | ICD-10-CM | POA: Diagnosis not present

## 2018-04-16 DIAGNOSIS — J4540 Moderate persistent asthma, uncomplicated: Secondary | ICD-10-CM | POA: Diagnosis not present

## 2018-04-16 DIAGNOSIS — D3709 Neoplasm of uncertain behavior of other specified sites of the oral cavity: Secondary | ICD-10-CM | POA: Diagnosis not present

## 2018-04-24 DIAGNOSIS — D3709 Neoplasm of uncertain behavior of other specified sites of the oral cavity: Secondary | ICD-10-CM | POA: Diagnosis not present

## 2018-05-05 DIAGNOSIS — J4520 Mild intermittent asthma, uncomplicated: Secondary | ICD-10-CM | POA: Diagnosis not present

## 2018-05-05 DIAGNOSIS — G5601 Carpal tunnel syndrome, right upper limb: Secondary | ICD-10-CM | POA: Diagnosis not present

## 2018-05-05 DIAGNOSIS — Z6820 Body mass index (BMI) 20.0-20.9, adult: Secondary | ICD-10-CM | POA: Diagnosis not present

## 2018-05-05 DIAGNOSIS — Z01419 Encounter for gynecological examination (general) (routine) without abnormal findings: Secondary | ICD-10-CM | POA: Diagnosis not present

## 2018-05-05 DIAGNOSIS — J4540 Moderate persistent asthma, uncomplicated: Secondary | ICD-10-CM | POA: Diagnosis not present

## 2018-05-05 DIAGNOSIS — Z309 Encounter for contraceptive management, unspecified: Secondary | ICD-10-CM | POA: Diagnosis not present

## 2018-05-09 DIAGNOSIS — Z01419 Encounter for gynecological examination (general) (routine) without abnormal findings: Secondary | ICD-10-CM | POA: Diagnosis not present

## 2018-05-09 DIAGNOSIS — G5601 Carpal tunnel syndrome, right upper limb: Secondary | ICD-10-CM | POA: Diagnosis not present

## 2018-05-09 DIAGNOSIS — Z6820 Body mass index (BMI) 20.0-20.9, adult: Secondary | ICD-10-CM | POA: Diagnosis not present

## 2018-05-09 DIAGNOSIS — Z309 Encounter for contraceptive management, unspecified: Secondary | ICD-10-CM | POA: Diagnosis not present

## 2018-05-12 DIAGNOSIS — J4540 Moderate persistent asthma, uncomplicated: Secondary | ICD-10-CM | POA: Diagnosis not present

## 2018-05-29 DIAGNOSIS — M79642 Pain in left hand: Secondary | ICD-10-CM | POA: Diagnosis not present

## 2018-05-29 DIAGNOSIS — M79641 Pain in right hand: Secondary | ICD-10-CM | POA: Diagnosis not present

## 2018-06-05 DIAGNOSIS — Z22321 Carrier or suspected carrier of Methicillin susceptible Staphylococcus aureus: Secondary | ICD-10-CM | POA: Diagnosis not present

## 2018-06-05 DIAGNOSIS — J4540 Moderate persistent asthma, uncomplicated: Secondary | ICD-10-CM | POA: Diagnosis not present

## 2018-06-05 DIAGNOSIS — D485 Neoplasm of uncertain behavior of skin: Secondary | ICD-10-CM | POA: Diagnosis not present

## 2018-06-05 DIAGNOSIS — Z23 Encounter for immunization: Secondary | ICD-10-CM | POA: Diagnosis not present

## 2018-06-05 DIAGNOSIS — D235 Other benign neoplasm of skin of trunk: Secondary | ICD-10-CM | POA: Diagnosis not present

## 2018-06-05 DIAGNOSIS — J4520 Mild intermittent asthma, uncomplicated: Secondary | ICD-10-CM | POA: Diagnosis not present

## 2018-06-05 DIAGNOSIS — D2272 Melanocytic nevi of left lower limb, including hip: Secondary | ICD-10-CM | POA: Diagnosis not present

## 2018-06-05 DIAGNOSIS — D225 Melanocytic nevi of trunk: Secondary | ICD-10-CM | POA: Diagnosis not present

## 2018-06-06 DIAGNOSIS — Z23 Encounter for immunization: Secondary | ICD-10-CM | POA: Diagnosis not present

## 2018-06-06 DIAGNOSIS — D2272 Melanocytic nevi of left lower limb, including hip: Secondary | ICD-10-CM | POA: Diagnosis not present

## 2018-06-06 DIAGNOSIS — D235 Other benign neoplasm of skin of trunk: Secondary | ICD-10-CM | POA: Diagnosis not present

## 2018-06-06 DIAGNOSIS — D225 Melanocytic nevi of trunk: Secondary | ICD-10-CM | POA: Diagnosis not present

## 2018-06-06 DIAGNOSIS — D2271 Melanocytic nevi of right lower limb, including hip: Secondary | ICD-10-CM | POA: Diagnosis not present

## 2018-06-06 DIAGNOSIS — D485 Neoplasm of uncertain behavior of skin: Secondary | ICD-10-CM | POA: Diagnosis not present

## 2018-06-06 DIAGNOSIS — Z22321 Carrier or suspected carrier of Methicillin susceptible Staphylococcus aureus: Secondary | ICD-10-CM | POA: Diagnosis not present

## 2018-06-09 DIAGNOSIS — J4540 Moderate persistent asthma, uncomplicated: Secondary | ICD-10-CM | POA: Diagnosis not present

## 2018-07-03 DIAGNOSIS — J4540 Moderate persistent asthma, uncomplicated: Secondary | ICD-10-CM | POA: Diagnosis not present

## 2018-07-03 DIAGNOSIS — J4520 Mild intermittent asthma, uncomplicated: Secondary | ICD-10-CM | POA: Diagnosis not present

## 2018-07-04 DIAGNOSIS — J4540 Moderate persistent asthma, uncomplicated: Secondary | ICD-10-CM | POA: Diagnosis not present

## 2018-07-08 DIAGNOSIS — J3089 Other allergic rhinitis: Secondary | ICD-10-CM | POA: Diagnosis not present

## 2018-07-08 DIAGNOSIS — J301 Allergic rhinitis due to pollen: Secondary | ICD-10-CM | POA: Diagnosis not present

## 2018-07-08 DIAGNOSIS — J4540 Moderate persistent asthma, uncomplicated: Secondary | ICD-10-CM | POA: Diagnosis not present

## 2018-07-08 DIAGNOSIS — K219 Gastro-esophageal reflux disease without esophagitis: Secondary | ICD-10-CM | POA: Diagnosis not present

## 2018-07-29 DIAGNOSIS — J4520 Mild intermittent asthma, uncomplicated: Secondary | ICD-10-CM | POA: Diagnosis not present

## 2018-07-29 DIAGNOSIS — J4540 Moderate persistent asthma, uncomplicated: Secondary | ICD-10-CM | POA: Diagnosis not present

## 2018-07-31 DIAGNOSIS — J4520 Mild intermittent asthma, uncomplicated: Secondary | ICD-10-CM | POA: Diagnosis not present

## 2018-07-31 DIAGNOSIS — J4540 Moderate persistent asthma, uncomplicated: Secondary | ICD-10-CM | POA: Diagnosis not present

## 2018-08-01 DIAGNOSIS — J4540 Moderate persistent asthma, uncomplicated: Secondary | ICD-10-CM | POA: Diagnosis not present

## 2018-08-21 DIAGNOSIS — B0229 Other postherpetic nervous system involvement: Secondary | ICD-10-CM | POA: Diagnosis not present

## 2018-08-26 DIAGNOSIS — J4540 Moderate persistent asthma, uncomplicated: Secondary | ICD-10-CM | POA: Diagnosis not present

## 2018-08-26 DIAGNOSIS — J4520 Mild intermittent asthma, uncomplicated: Secondary | ICD-10-CM | POA: Diagnosis not present

## 2018-08-29 DIAGNOSIS — J4540 Moderate persistent asthma, uncomplicated: Secondary | ICD-10-CM | POA: Diagnosis not present

## 2018-09-22 DIAGNOSIS — J4540 Moderate persistent asthma, uncomplicated: Secondary | ICD-10-CM | POA: Diagnosis not present

## 2018-09-22 DIAGNOSIS — J4520 Mild intermittent asthma, uncomplicated: Secondary | ICD-10-CM | POA: Diagnosis not present

## 2018-09-23 DIAGNOSIS — J4540 Moderate persistent asthma, uncomplicated: Secondary | ICD-10-CM | POA: Diagnosis not present

## 2018-09-23 DIAGNOSIS — J4520 Mild intermittent asthma, uncomplicated: Secondary | ICD-10-CM | POA: Diagnosis not present

## 2018-09-26 DIAGNOSIS — J4540 Moderate persistent asthma, uncomplicated: Secondary | ICD-10-CM | POA: Diagnosis not present

## 2018-10-24 DIAGNOSIS — J4540 Moderate persistent asthma, uncomplicated: Secondary | ICD-10-CM | POA: Diagnosis not present

## 2018-11-15 DIAGNOSIS — J4540 Moderate persistent asthma, uncomplicated: Secondary | ICD-10-CM | POA: Diagnosis not present

## 2018-11-15 DIAGNOSIS — J4520 Mild intermittent asthma, uncomplicated: Secondary | ICD-10-CM | POA: Diagnosis not present

## 2018-11-19 DIAGNOSIS — J4540 Moderate persistent asthma, uncomplicated: Secondary | ICD-10-CM | POA: Diagnosis not present

## 2018-12-15 DIAGNOSIS — J4540 Moderate persistent asthma, uncomplicated: Secondary | ICD-10-CM | POA: Diagnosis not present

## 2019-01-12 DIAGNOSIS — J4540 Moderate persistent asthma, uncomplicated: Secondary | ICD-10-CM | POA: Diagnosis not present

## 2019-02-09 DIAGNOSIS — J4540 Moderate persistent asthma, uncomplicated: Secondary | ICD-10-CM | POA: Diagnosis not present

## 2019-03-09 DIAGNOSIS — J4540 Moderate persistent asthma, uncomplicated: Secondary | ICD-10-CM | POA: Diagnosis not present

## 2019-05-14 ENCOUNTER — Ambulatory Visit: Payer: BC Managed Care – PPO | Attending: Internal Medicine

## 2019-05-14 DIAGNOSIS — E222 Syndrome of inappropriate secretion of antidiuretic hormone: Secondary | ICD-10-CM

## 2019-05-14 DIAGNOSIS — Z20822 Contact with and (suspected) exposure to covid-19: Secondary | ICD-10-CM

## 2019-05-15 ENCOUNTER — Ambulatory Visit: Payer: BC Managed Care – PPO | Attending: Internal Medicine

## 2019-05-15 DIAGNOSIS — Z20822 Contact with and (suspected) exposure to covid-19: Secondary | ICD-10-CM

## 2019-05-15 LAB — NOVEL CORONAVIRUS, NAA: SARS-CoV-2, NAA: NOT DETECTED

## 2019-05-16 LAB — NOVEL CORONAVIRUS, NAA: SARS-CoV-2, NAA: NOT DETECTED

## 2019-05-20 ENCOUNTER — Ambulatory Visit: Payer: BC Managed Care – PPO

## 2020-04-02 IMAGING — DX DG CHEST 2V
2 series · 2 of 2 positions shown · non-contrast
Comparison: 12/08/2014.

CLINICAL DATA: Right-sided chest pain.

EXAM:
CHEST - 2 VIEW

[chest pa]
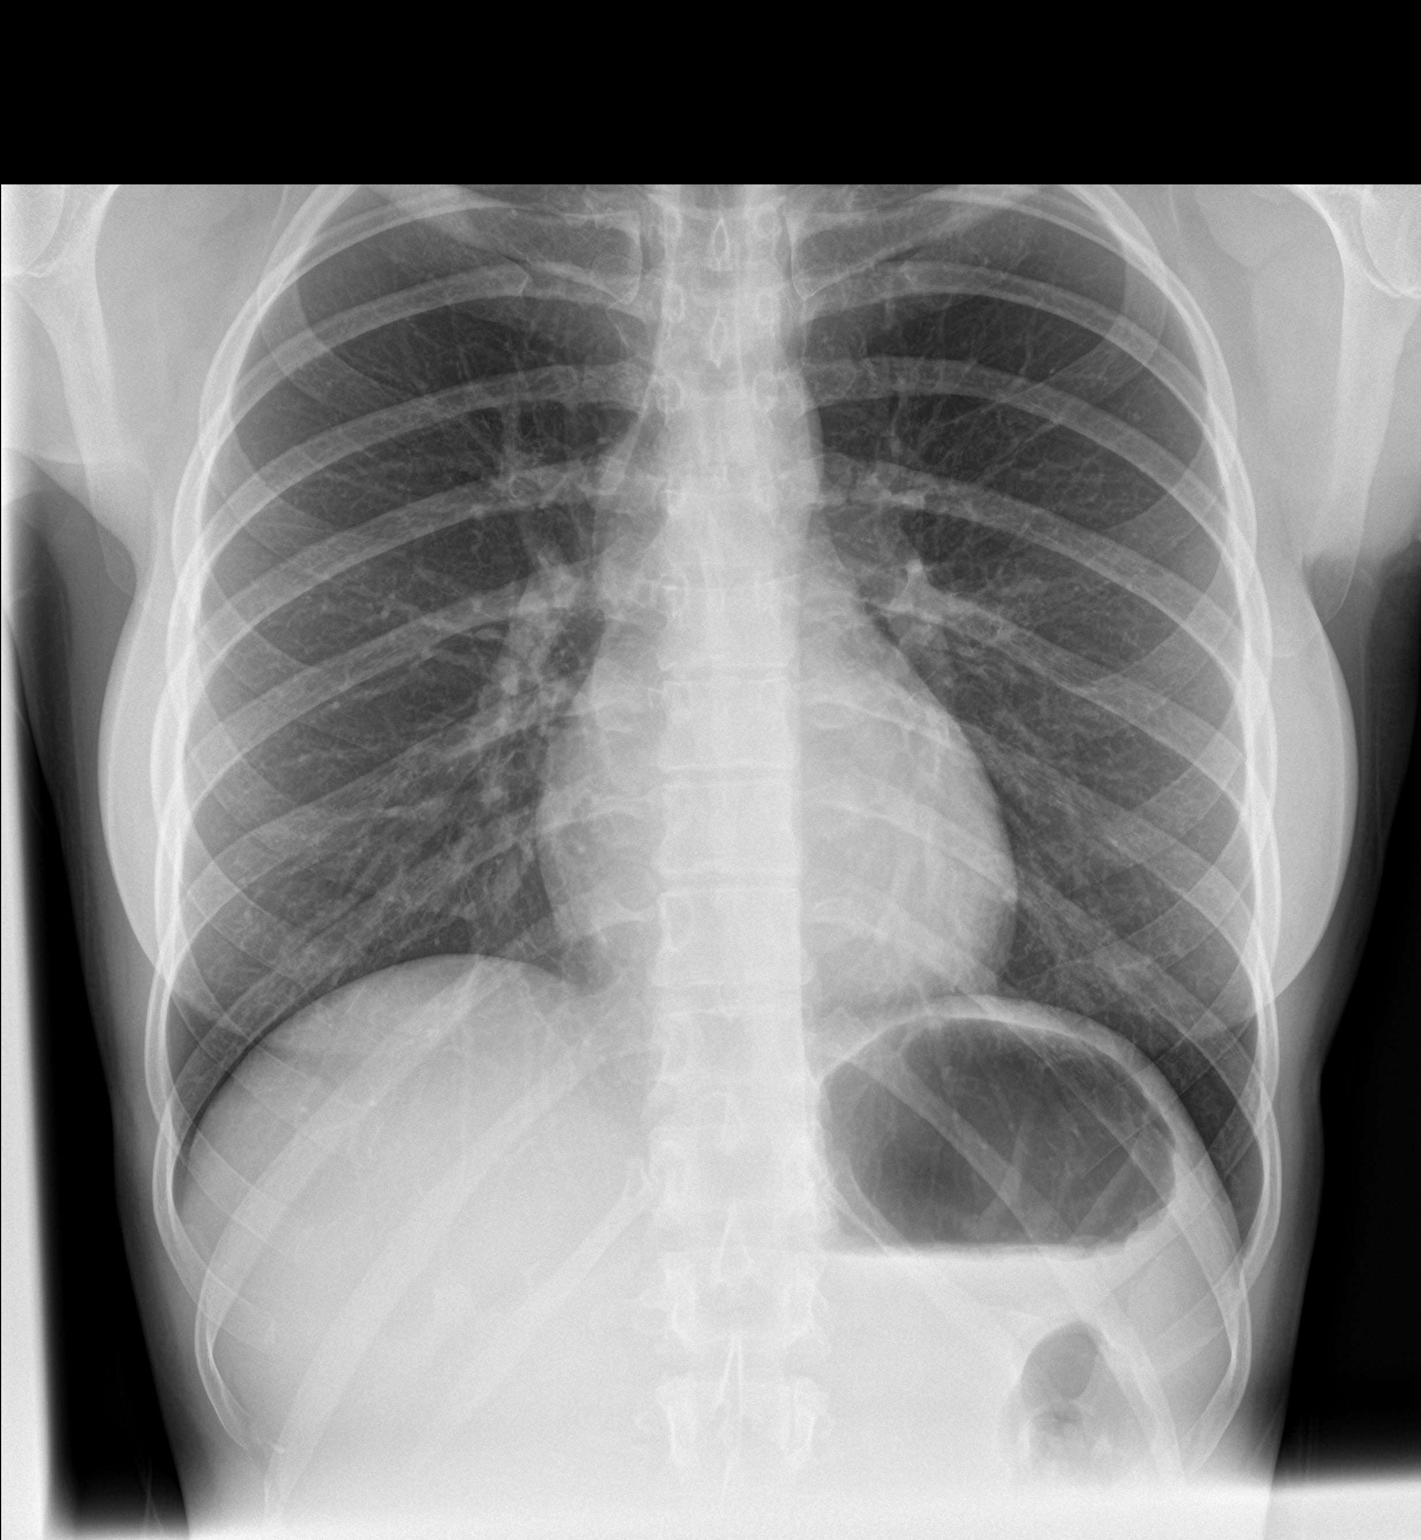

[chest lat]
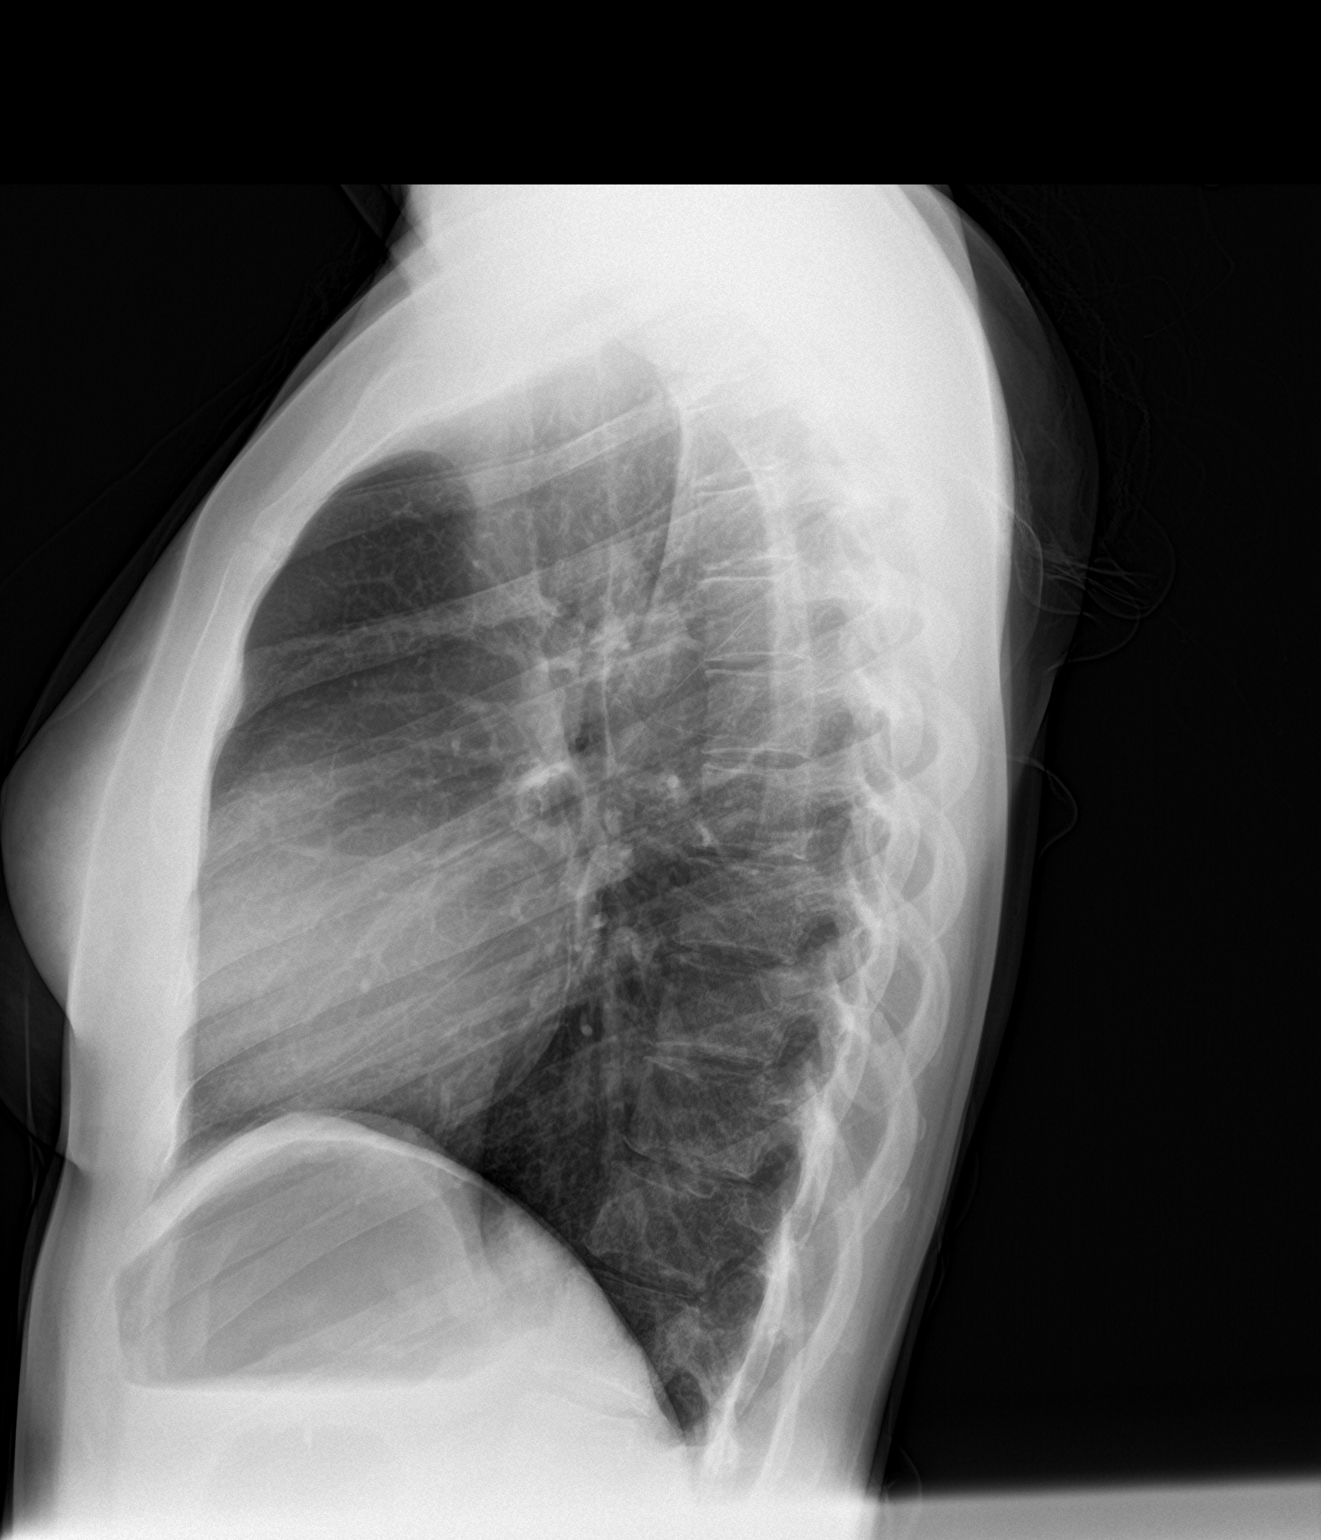

[2 of 2 positions shown; findings below may reference images not displayed]

FINDINGS: Mediastinum and hilar structures normal. Lungs are clear. No pleural
effusion or pneumothorax. Heart size normal. No acute bony
abnormality.
IMPRESSION: No acute cardiopulmonary disease.
